# Patient Record
Sex: Male | Born: 1975 | Race: Black or African American | Hispanic: No | Marital: Single | Smoking: Current every day smoker
Health system: Southern US, Community
[De-identification: ages and names within clinical notes are randomized; demographics above are authoritative.]

## PROBLEM LIST (undated history)

## (undated) DIAGNOSIS — F259 Schizoaffective disorder, unspecified: Secondary | ICD-10-CM

## (undated) DIAGNOSIS — F3181 Bipolar II disorder: Secondary | ICD-10-CM

## (undated) DIAGNOSIS — E119 Type 2 diabetes mellitus without complications: Secondary | ICD-10-CM

---

## 1997-10-16 ENCOUNTER — Emergency Department (HOSPITAL_COMMUNITY): Admission: EM | Admit: 1997-10-16 | Discharge: 1997-10-16 | Payer: Self-pay | Admitting: Emergency Medicine

## 1997-11-02 ENCOUNTER — Emergency Department (HOSPITAL_COMMUNITY): Admission: EM | Admit: 1997-11-02 | Discharge: 1997-11-02 | Payer: Self-pay | Admitting: Emergency Medicine

## 1997-11-15 ENCOUNTER — Emergency Department (HOSPITAL_COMMUNITY): Admission: EM | Admit: 1997-11-15 | Discharge: 1997-11-15 | Payer: Self-pay | Admitting: Emergency Medicine

## 2000-09-08 ENCOUNTER — Emergency Department (HOSPITAL_COMMUNITY): Admission: EM | Admit: 2000-09-08 | Discharge: 2000-09-08 | Payer: Self-pay

## 2001-08-26 ENCOUNTER — Inpatient Hospital Stay (HOSPITAL_COMMUNITY): Admission: EM | Admit: 2001-08-26 | Discharge: 2001-09-07 | Payer: Self-pay | Admitting: Psychiatry

## 2004-01-21 ENCOUNTER — Emergency Department: Payer: Self-pay | Admitting: Emergency Medicine

## 2004-07-18 ENCOUNTER — Emergency Department: Payer: Self-pay | Admitting: Unknown Physician Specialty

## 2005-10-09 ENCOUNTER — Emergency Department (HOSPITAL_COMMUNITY): Admission: EM | Admit: 2005-10-09 | Discharge: 2005-10-09 | Payer: Self-pay | Admitting: Emergency Medicine

## 2005-10-30 ENCOUNTER — Inpatient Hospital Stay (HOSPITAL_COMMUNITY): Admission: AD | Admit: 2005-10-30 | Discharge: 2005-11-09 | Payer: Self-pay | Admitting: Psychiatry

## 2005-10-30 ENCOUNTER — Ambulatory Visit: Payer: Self-pay | Admitting: Psychiatry

## 2006-06-14 IMAGING — CR DG FEMUR 2V*L*
1 series · 4 of 4 positions shown · non-contrast
Comparison: none

REASON FOR EXAM: Injury
COMMENTS:

PROCEDURE:     DXR - DXR FEMUR LEFT  - July 18, 2004 [DATE]
RESULT:     AP and lateral views of the LEFT femur show no fracture,
dislocation or other acute bony abnormality. The hip joint space is well
maintained.

[Series 1: view not recorded · 0.17mm/px · 4 of 4 slices shown]
[im 1/4]
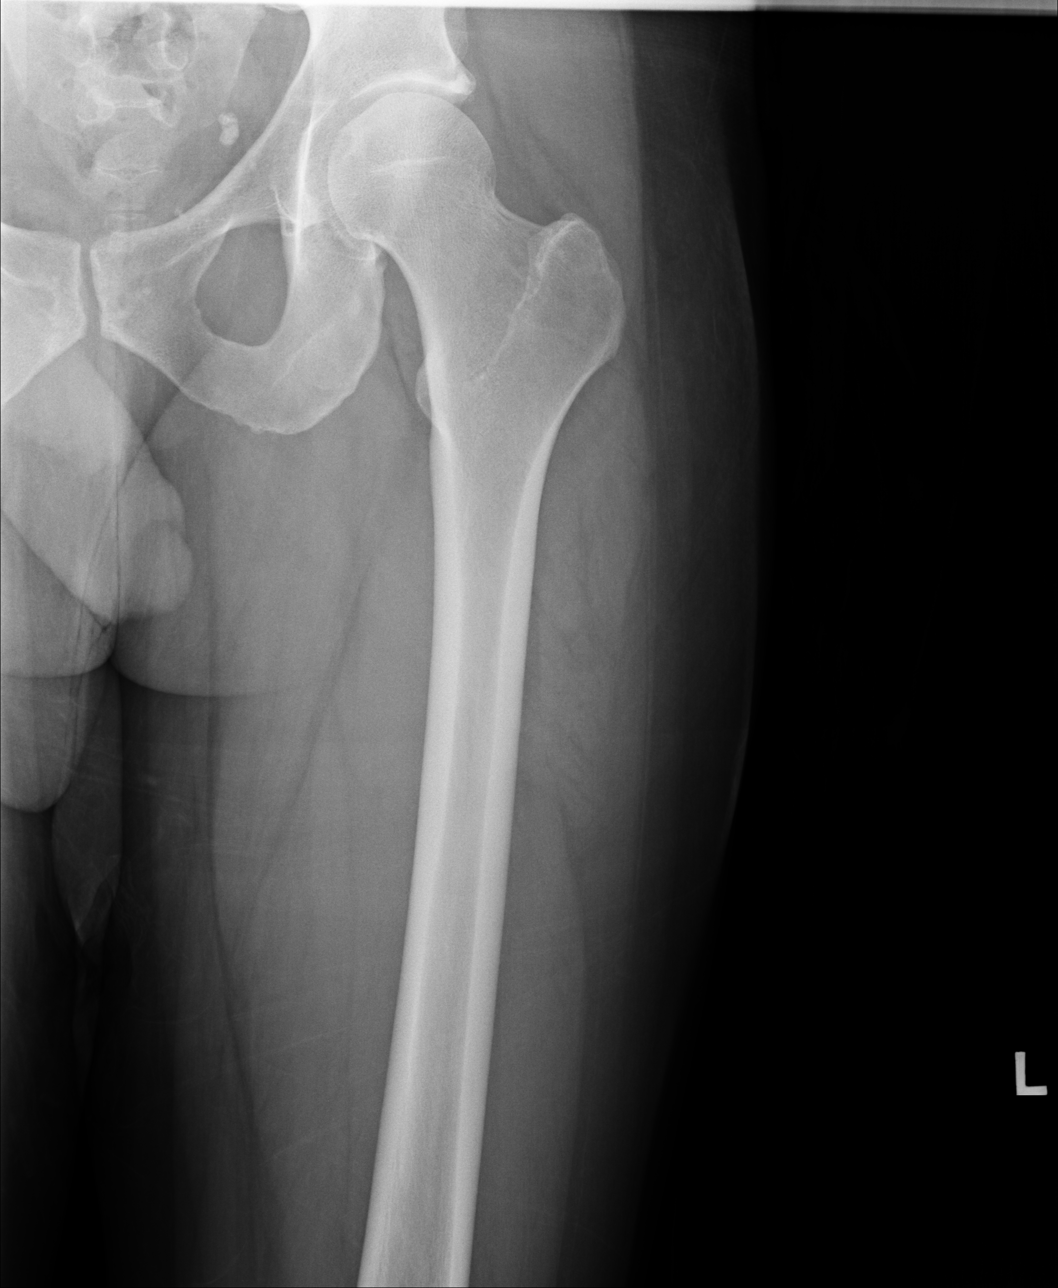
[im 2/4]
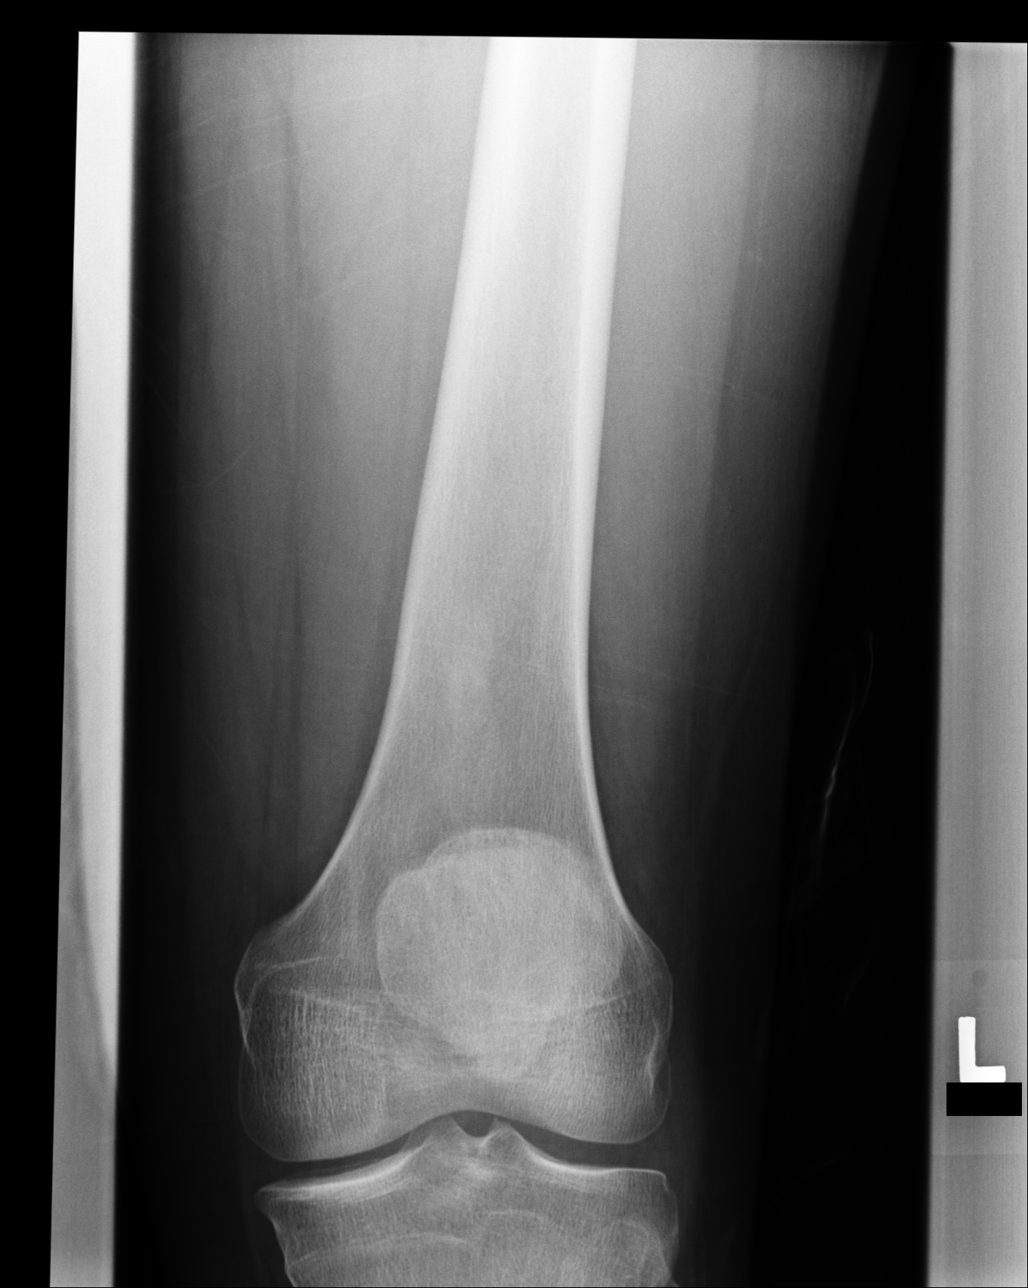
[im 3/4]
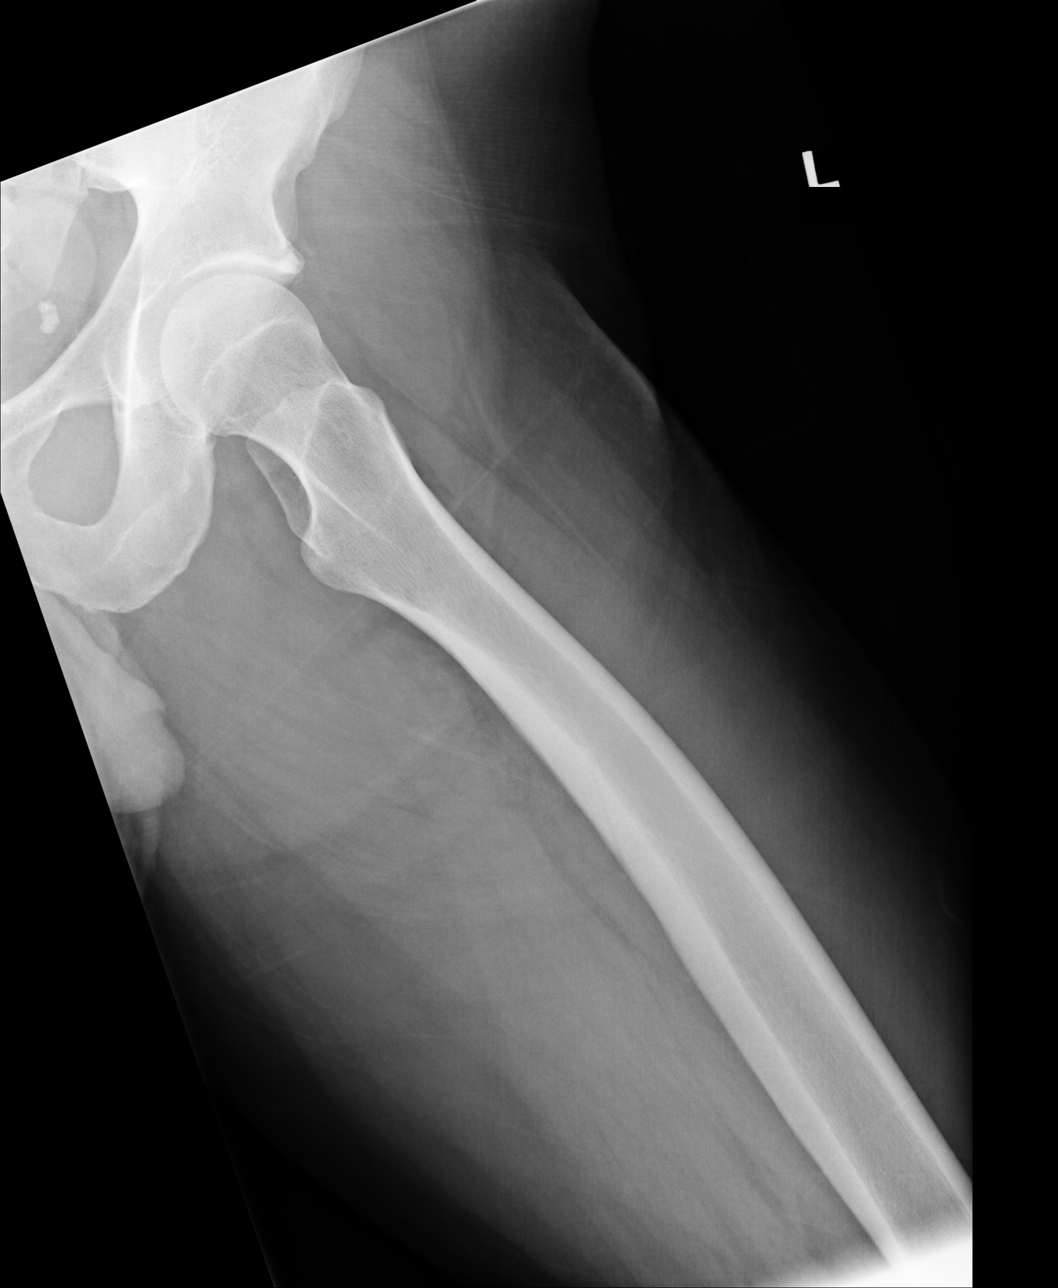
[im 4/4]
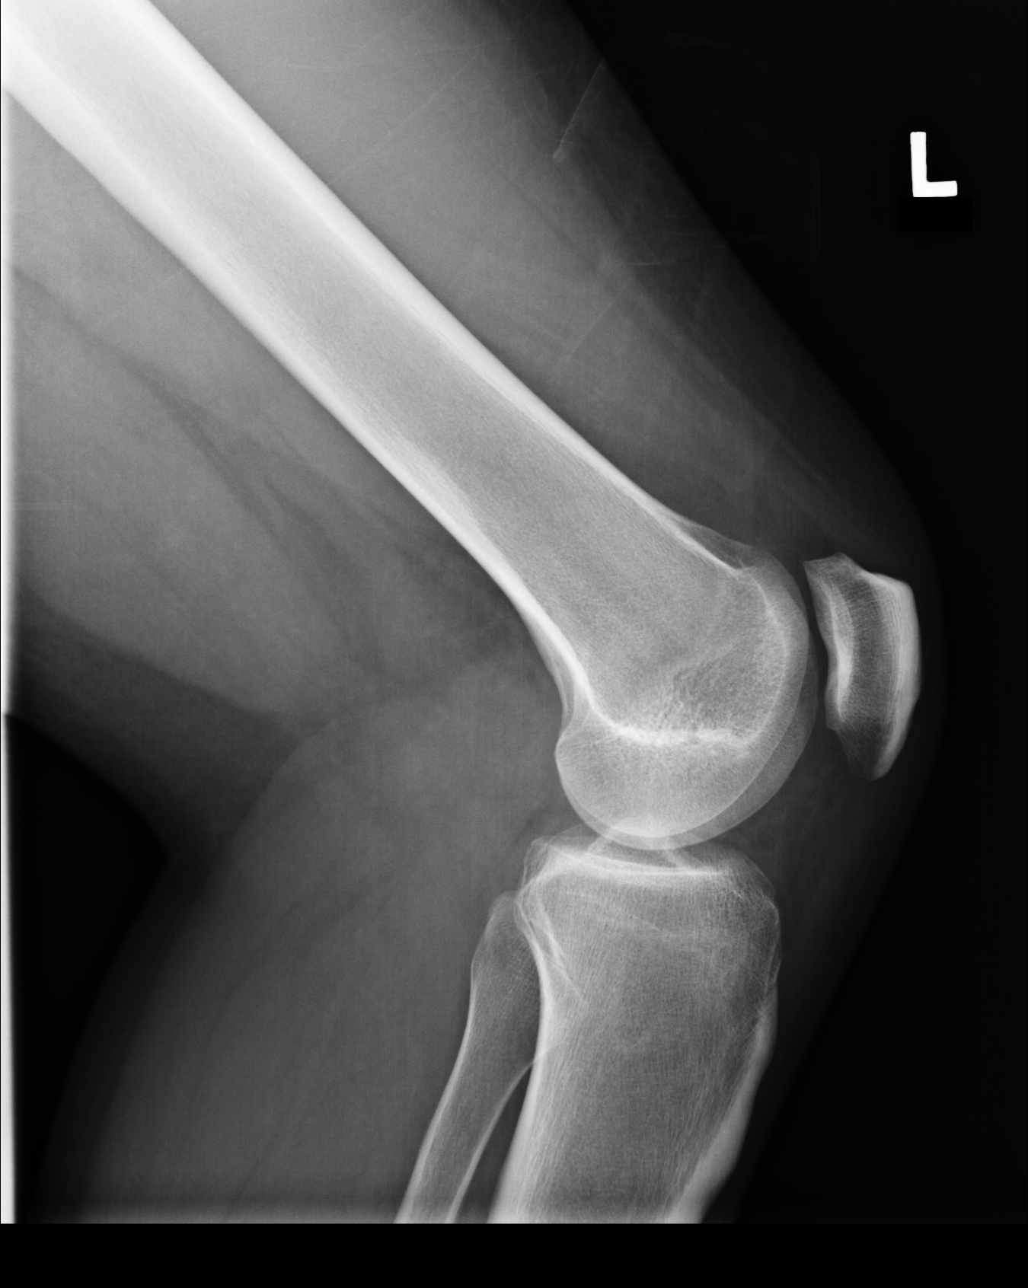

[4 of 4 positions shown; findings below may reference images not displayed]

IMPRESSION: No significant abnormalities are noted.

## 2007-06-26 ENCOUNTER — Inpatient Hospital Stay (HOSPITAL_COMMUNITY): Admission: AD | Admit: 2007-06-26 | Discharge: 2007-07-01 | Payer: Self-pay | Admitting: Psychiatry

## 2007-06-27 ENCOUNTER — Ambulatory Visit: Payer: Self-pay | Admitting: Psychiatry
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
## 2010-12-22 LAB — TSH: TSH: 5.538 — ABNORMAL HIGH

## 2010-12-22 LAB — VALPROIC ACID LEVEL: Valproic Acid Lvl: 35.8 — ABNORMAL LOW

## 2010-12-22 LAB — URINALYSIS, ROUTINE W REFLEX MICROSCOPIC
Bilirubin Urine: NEGATIVE
Ketones, ur: NEGATIVE
Nitrite: NEGATIVE
Urobilinogen, UA: 1
pH: 6.5

## 2010-12-22 LAB — DRUGS OF ABUSE SCREEN W/O ALC, ROUTINE URINE
Benzodiazepines.: NEGATIVE
Opiate Screen, Urine: NEGATIVE
Phencyclidine (PCP): NEGATIVE
Propoxyphene: NEGATIVE

## 2010-12-22 LAB — COMPREHENSIVE METABOLIC PANEL
Albumin: 3.5
BUN: 13
Chloride: 106
Creatinine, Ser: 0.93
GFR calc non Af Amer: >60
Glucose, Bld: 134 — ABNORMAL HIGH
Total Bilirubin: 0.4

## 2010-12-22 LAB — CBC
HCT: 37.3 — ABNORMAL LOW
Hemoglobin: 12.8 — ABNORMAL LOW
MCV: 97.9
Platelets: 141 — ABNORMAL LOW
WBC: 6.4

## 2015-04-26 ENCOUNTER — Encounter (HOSPITAL_COMMUNITY): Payer: Self-pay | Admitting: Emergency Medicine

## 2015-04-26 ENCOUNTER — Emergency Department (HOSPITAL_COMMUNITY)
Admission: EM | Admit: 2015-04-26 | Discharge: 2015-04-27 | Disposition: A | Discharge status: Home / Self Care | Payer: Medicare HMO | Attending: Emergency Medicine | Admitting: Emergency Medicine

## 2015-04-26 DIAGNOSIS — Z7984 Long term (current) use of oral hypoglycemic drugs: Secondary | ICD-10-CM | POA: Diagnosis not present

## 2015-04-26 DIAGNOSIS — F4325 Adjustment disorder with mixed disturbance of emotions and conduct: Secondary | ICD-10-CM | POA: Insufficient documentation

## 2015-04-26 DIAGNOSIS — Z79899 Other long term (current) drug therapy: Secondary | ICD-10-CM | POA: Insufficient documentation

## 2015-04-26 DIAGNOSIS — E119 Type 2 diabetes mellitus without complications: Secondary | ICD-10-CM | POA: Diagnosis not present

## 2015-04-26 DIAGNOSIS — Z7982 Long term (current) use of aspirin: Secondary | ICD-10-CM | POA: Insufficient documentation

## 2015-04-26 DIAGNOSIS — F3181 Bipolar II disorder: Secondary | ICD-10-CM | POA: Diagnosis not present

## 2015-04-26 DIAGNOSIS — F6089 Other specific personality disorders: Secondary | ICD-10-CM | POA: Diagnosis not present

## 2015-04-26 DIAGNOSIS — R4689 Other symptoms and signs involving appearance and behavior: Secondary | ICD-10-CM

## 2015-04-26 DIAGNOSIS — F911 Conduct disorder, childhood-onset type: Secondary | ICD-10-CM | POA: Diagnosis not present

## 2015-04-26 HISTORY — DX: Bipolar II disorder: F31.81

## 2015-04-26 HISTORY — DX: Type 2 diabetes mellitus without complications: E11.9

## 2015-04-26 HISTORY — DX: Schizoaffective disorder, unspecified: F25.9

## 2015-04-26 LAB — CBC
HCT: 37 % — ABNORMAL LOW (ref 39.0–52.0)
Hemoglobin: 12.9 g/dL — ABNORMAL LOW (ref 13.0–17.0)
MCH: 34 pg (ref 26.0–34.0)
MCHC: 34.9 g/dL (ref 30.0–36.0)
MCV: 97.6 fL (ref 78.0–100.0)
Platelets: 235 10*3/uL (ref 150–400)
RBC: 3.79 MIL/uL — ABNORMAL LOW (ref 4.22–5.81)
RDW: 13.1 % (ref 11.5–15.5)
WBC: 7.6 10*3/uL (ref 4.0–10.5)

## 2015-04-26 LAB — COMPREHENSIVE METABOLIC PANEL
ALT: 11 U/L — ABNORMAL LOW (ref 17–63)
AST: 20 U/L (ref 15–41)
Albumin: 4 g/dL (ref 3.5–5.0)
Alkaline Phosphatase: 58 U/L (ref 38–126)
Anion gap: 9 (ref 5–15)
BUN: 10 mg/dL (ref 6–20)
CO2: 22 mmol/L (ref 22–32)
Calcium: 8.6 mg/dL — ABNORMAL LOW (ref 8.9–10.3)
Chloride: 106 mmol/L (ref 101–111)
Creatinine, Ser: 0.78 mg/dL (ref 0.61–1.24)
GFR calc Af Amer: >60 mL/min (ref >60)
GFR calc non Af Amer: >60 mL/min (ref >60)
Glucose, Bld: 180 mg/dL — ABNORMAL HIGH (ref 65–99)
Potassium: 3.7 mmol/L (ref 3.5–5.1)
Sodium: 137 mmol/L (ref 135–145)
Total Bilirubin: 0.4 mg/dL (ref 0.3–1.2)
Total Protein: 7.1 g/dL (ref 6.5–8.1)

## 2015-04-26 LAB — RAPID URINE DRUG SCREEN, HOSP PERFORMED
Amphetamines: NOT DETECTED
Barbiturates: NOT DETECTED
Benzodiazepines: NOT DETECTED
Cocaine: NOT DETECTED
Opiates: NOT DETECTED
Tetrahydrocannabinol: NOT DETECTED

## 2015-04-26 LAB — SALICYLATE LEVEL: Salicylate Lvl: <4 mg/dL (ref 2.8–30.0)

## 2015-04-26 LAB — ACETAMINOPHEN LEVEL: Acetaminophen (Tylenol), Serum: <10 ug/mL — ABNORMAL LOW (ref 10–30)

## 2015-04-26 LAB — ETHANOL: Alcohol, Ethyl (B): <5 mg/dL (ref <5)

## 2015-04-26 MED ORDER — ACETAMINOPHEN 325 MG PO TABS: 650.0000 mg | ORAL_TABLET | ORAL | Status: DC | PRN

## 2015-04-26 MED ORDER — NICOTINE 21 MG/24HR TD PT24
21.0000 mg | MEDICATED_PATCH | Freq: Every day | TRANSDERMAL | Status: DC
  Filled 2015-04-26: qty 1

## 2015-04-26 MED ORDER — LORAZEPAM 1 MG PO TABS: 0.0000 mg | ORAL_TABLET | Freq: Two times a day (BID) | ORAL | Status: DC

## 2015-04-26 MED ORDER — ZOLPIDEM TARTRATE 5 MG PO TABS: 5.0000 mg | ORAL_TABLET | Freq: Every evening | ORAL | Status: DC | PRN

## 2015-04-26 MED ORDER — LORAZEPAM 1 MG PO TABS: 0.0000 mg | ORAL_TABLET | Freq: Four times a day (QID) | ORAL | Status: DC

## 2015-04-26 MED ORDER — LORAZEPAM 1 MG PO TABS: 1.0000 mg | ORAL_TABLET | Freq: Three times a day (TID) | ORAL | Status: DC | PRN

## 2015-04-26 MED ORDER — ALUM & MAG HYDROXIDE-SIMETH 200-200-20 MG/5ML PO SUSP: 30.0000 mL | ORAL | Status: DC | PRN

## 2015-04-26 MED ORDER — ONDANSETRON HCL 4 MG PO TABS: 4.0000 mg | ORAL_TABLET | Freq: Three times a day (TID) | ORAL | Status: DC | PRN

## 2015-04-26 MED ORDER — IBUPROFEN 200 MG PO TABS: 600.0000 mg | ORAL_TABLET | Freq: Three times a day (TID) | ORAL | Status: DC | PRN

## 2015-04-26 NOTE — ED Notes (Signed)
Pt. To [facility] from ED ambulatory without difficulty, to room 35 . Report from Marinda Elk RN. Pt. Is alert and oriented, warm and dry in no distress. Pt. Denies SI, HI, and AVH. Pt. Calm and cooperative. Pt. Made aware of security cameras and Q15 minute rounds. Pt. Encouraged to let Nursing staff know of any concerns or needs.
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
## 2015-04-27 DIAGNOSIS — F4325 Adjustment disorder with mixed disturbance of emotions and conduct: Secondary | ICD-10-CM | POA: Diagnosis not present

## 2015-04-27 DIAGNOSIS — F911 Conduct disorder, childhood-onset type: Secondary | ICD-10-CM | POA: Diagnosis not present

## 2015-04-27 DIAGNOSIS — F6089 Other specific personality disorders: Secondary | ICD-10-CM | POA: Diagnosis not present

## 2015-04-27 DIAGNOSIS — R4689 Other symptoms and signs involving appearance and behavior: Secondary | ICD-10-CM | POA: Insufficient documentation

## 2015-04-27 LAB — CBG MONITORING, ED: Glucose-Capillary: 131 mg/dL — ABNORMAL HIGH (ref 65–99)

## 2015-04-27 MED ORDER — DIVALPROEX SODIUM ER 500 MG PO TB24: 1000.0000 mg | ORAL_TABLET | Freq: Every day | ORAL | Status: DC

## 2015-04-27 MED ORDER — ASPIRIN 81 MG PO CHEW
81.0000 mg | CHEWABLE_TABLET | Freq: Every day | ORAL | Status: DC
  Administered 2015-04-27: 81 mg via ORAL
  Filled 2015-04-27: qty 1

## 2015-04-27 MED ORDER — HALOPERIDOL 5 MG PO TABS: 15.0000 mg | ORAL_TABLET | Freq: Every day | ORAL | Status: DC

## 2015-04-27 MED ORDER — LISINOPRIL 10 MG PO TABS
10.0000 mg | ORAL_TABLET | Freq: Every day | ORAL | Status: DC
  Administered 2015-04-27: 10 mg via ORAL
  Filled 2015-04-27: qty 1

## 2015-04-27 MED ORDER — POTASSIUM CHLORIDE CRYS ER 20 MEQ PO TBCR
40.0000 meq | EXTENDED_RELEASE_TABLET | Freq: Every day | ORAL | Status: DC
  Administered 2015-04-27: 40 meq via ORAL
  Filled 2015-04-27: qty 2

## 2015-04-27 MED ORDER — METFORMIN HCL 500 MG PO TABS
500.0000 mg | ORAL_TABLET | Freq: Two times a day (BID) | ORAL | Status: DC
  Filled 2015-04-27 (×2): qty 1

## 2015-04-27 NOTE — ED Notes (Signed)
Up to the bathroom 

## 2015-04-27 NOTE — ED Notes (Signed)
Pt. Noted sleeping in room. No complaints or concerns voiced. No distress or abnormal behavior noted. Will continue to monitor with security cameras. Q 15 minute rounds continue. 

## 2015-04-27 NOTE — Clinical Social Work Note (Signed)
CSW faxed pt's rescinded IVC paperwork to the magistrates office.   Akshitha Culmer LCSW [facility]

## 2015-04-27 NOTE — ED Notes (Addendum)
Written dc instructions reviewed w/ Pt.  Pt encouraged to take his medications as directed and if to work with/notify staff if he becomes upset with another resident.  Pt verbalized understanding. Pt ambulatory w/o difficulty to [facility] w/ [facility], belongings returned, MAR and copy of dc instructions sent w/ pt for [facility].

## 2015-04-27 NOTE — [facility] Suicide Risk Assessment (Signed)
Suicide Risk Assessment  Discharge Assessment   [facility] Discharge Suicide Risk Assessment   Principal Problem: Adjustment disorder with mixed disturbance of emotions and conduct Discharge Diagnoses:  Patient Active Problem List   Diagnosis Date Noted  . Adjustment disorder with mixed disturbance of emotions and conduct [F43.25] 04/27/2015    Priority: High  . Aggressive behavior [F60.89]     Total Time spent with patient: 45 minutes   Musculoskeletal: Strength & Muscle Tone: within normal limits Gait & Station: normal Patient leans: N/A  Psychiatric Specialty Exam: Review of Systems  Constitutional: Negative.   HENT: Negative.   Eyes: Negative.   Respiratory: Negative.   Cardiovascular: Negative.   Gastrointestinal: Negative.   Genitourinary: Negative.   Musculoskeletal: Negative.   Skin: Negative.   Neurological: Negative.   Endo/Heme/Allergies: Negative.   Psychiatric/Behavioral:       Negative    Blood pressure 119/85, pulse 82, temperature 97.8 F (36.6 C), temperature source Oral, resp. rate 18, SpO2 99 %.There is no height or weight on file to calculate BMI.  General Appearance: Casual  Eye Contact::  Good  Speech:  Normal Rate  Volume:  Normal  Mood:  Euthymic  Affect:  Congruent  Thought Process:  Coherent  Orientation:  Full (Time, Place, and Person)  Thought Content:  WDL  Suicidal Thoughts:  No  Homicidal Thoughts:  No  Memory:  Immediate;   Fair Recent;   Fair Remote;   Fair  Judgement:  Fair  Insight:  Good  Psychomotor Activity:  Normal  Concentration:  Good  Recall:  Good  Fund of Knowledge: Fair  Language: Good  Akathisia:  No  Handed:  Right  AIMS (if indicated):     Assets:  Housing Leisure Time Physical Health Resilience Social Support  ADL's:  Intact  Cognition: Impaired,  Mild  Sleep:       Mental Status Per Nursing Assessment::   On Admission:   agitation   Demographic Factors:  Male  Loss Factors: NA  Historical  Factors: Impulsivity  Risk Reduction Factors:   Sense of responsibility to family, Living with another person, especially a relative, Positive social support and Positive therapeutic relationship  Continued Clinical Symptoms:  None  Cognitive Features That Contribute To Risk:  None    Suicide Risk:  Minimal: No identifiable suicidal ideation.  Patients presenting with no risk factors but with morbid ruminations; may be classified as minimal risk based on the severity of the depressive symptoms    Plan Of Care/Follow-up recommendations:  Activity:  as tolerated  Diet:  heart healthy diet  Keyonia Gluth, NP 04/27/2015, 4:55 PM
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
## 2015-04-27 NOTE — Consult Note (Signed)
[facility] Face-to-Face Psychiatry Consult   Reason for Consult:  Threats of harm to others and self Referring Physician:  EDP Patient Identification: Jason Jenkins MRN:  [id number] Principal Diagnosis: Adjustment disorder with mixed disturbance of emotions and conduct Diagnosis:   Patient Active Problem List   Diagnosis Date Noted  . Adjustment disorder with mixed disturbance of emotions and conduct [F43.25] 04/27/2015    Priority: High    Total Time spent with patient: 45 minutes  Subjective:   Jason Jenkins is a 40 y.o. male patient does not warrant admission.  HPI:  On admission:  40 y.o. male.  -Clinician reviewed note by Clayton Bibles, PA. Pt had an argument with another resident at [facility]. Patient reportedly had threatened harm to the other resident and staff. The [facility] staff had taken out IVC papers on patient.  Patient was drowsy when assessed. He did not give much information. Patient is unclear about how long he has been living at [facility]. Patient thought he had been there for a year. Patient denies making threats to harm staff and residents. Patient acknowledges that he and other resident did argue but that it did not escalate to being physical. Pt says that he went back to his room after the argument and then the police picked him up later and brought him to [facility].  Patient denies any current SI, HI or A/V hallucinations. He has no current psychiatrist. He goes to a day program called "[facility]" on the weekdays. Patient cites his mother and extended family as being supportive of him.  IVC papers record that patient has made threats to staff to harm them with a knife. Patient talks about having killed a man and the he would kill himself also. Patient is prescribed haldol and depakote which he takes regularly.  Today:  Patient denies suicidal/homicidal ideations, hallucinations, and alcohol/drug abuse.  Calm on assessment and requests to return to [facility].  He reports another resident upset him yesterday but he is no longer upset.  Mr. Vivona appears to be intellectually limited but no threat to himself.  Group home will be notified and he may return.  Past Psychiatric History: Aggression, mood disorder  Risk to Self: Suicidal Ideation: No Suicidal Intent: No Is patient at risk for suicide?: No Suicidal Plan?: No Access to Means: No What has been your use of drugs/alcohol within the last 12 months?: Pt denies How many times?: 0 Other Self Harm Risks: None Triggers for Past Attempts: None known Intentional Self Injurious Behavior: None Risk to Others: Homicidal Ideation: No Thoughts of Harm to Others: No Current Homicidal Intent: No Current Homicidal Plan: No Access to Homicidal Means: No Identified Victim: No one History of harm to others?: No Assessment of Violence: None Noted Violent Behavior Description: Pt denies Does patient have access to weapons?: No Criminal Charges Pending?: No Does patient have a court date: No Prior Inpatient Therapy: Prior Inpatient Therapy: Yes Prior Therapy Dates: Pt cannot recall Prior Therapy Facilty/Provider(s): [facility] Reason for Treatment: depression Prior Outpatient Therapy: Prior Outpatient Therapy: Yes Prior Therapy Dates: Current Prior Therapy Facilty/Provider(s): [facility] day program Reason for Treatment: Day progrming Does patient have an ACCT team?: No Does patient have Intensive In-House Services?  : No Does patient have [facility] services? : No Does patient have [facility] services?: No  Past Medical History:  Past Medical History  Diagnosis Date  . Bipolar 2 disorder ([place])   . Diabetes mellitus without complication ([place])   .  Schizoaffective disorder ([place])    History reviewed. No pertinent past surgical history. Family History: No family history on file. Family Psychiatric  History: None Social History:  History  Alcohol Use No     History  Drug Use No    Social  History   Social History  . Marital Status: Single    Spouse Name: N/A  . Number of Children: N/A  . Years of Education: N/A   Social History Main Topics  . Smoking status: Never Smoker   . Smokeless tobacco: None  . Alcohol Use: No  . Drug Use: No  . Sexual Activity: Not Asked   Other Topics Concern  . None   Social History Narrative  . None   Additional Social History:    Pain Medications: See PTA medication list Prescriptions: See PTA medication list Over the Counter: See PTA medication list History of alcohol / drug use?: No history of alcohol / drug abuse                     Allergies:   Allergies  Allergen Reactions  . Bee Venom   . Fish Allergy     Labs:  Results for orders placed or performed during the hospital encounter of 04/26/15 (from the past 48 hour(s))  Comprehensive metabolic panel     Status: Abnormal   Collection Time: 04/26/15  9:39 PM  Result Value Ref Range   Sodium 137 135 - 145 mmol/L   Potassium 3.7 3.5 - 5.1 mmol/L   Chloride 106 101 - 111 mmol/L   CO2 22 22 - 32 mmol/L   Glucose, Bld 180 (H) 65 - 99 mg/dL   BUN 10 6 - 20 mg/dL   Creatinine, Ser 0.78 0.61 - 1.24 mg/dL   Calcium 8.6 (L) 8.9 - 10.3 mg/dL   Total Protein 7.1 6.5 - 8.1 g/dL   Albumin 4.0 3.5 - 5.0 g/dL   AST 20 15 - 41 U/L   ALT 11 (L) 17 - 63 U/L   Alkaline Phosphatase 58 38 - 126 U/L   Total Bilirubin 0.4 0.3 - 1.2 mg/dL   GFR calc non Af Amer >60 >60 mL/min   GFR calc Af Amer >60 >60 mL/min    Comment: (NOTE) The eGFR has been calculated using the CKD EPI equation. This calculation has not been validated in all clinical situations. eGFR's persistently <60 mL/min signify possible Chronic Kidney Disease.    Anion gap 9 5 - 15  Ethanol (ETOH)     Status: None   Collection Time: 04/26/15  9:39 PM  Result Value Ref Range   Alcohol, Ethyl (B) <5 <5 mg/dL    Comment:        LOWEST DETECTABLE LIMIT FOR SERUM ALCOHOL IS 5 mg/dL FOR MEDICAL PURPOSES ONLY    Salicylate level     Status: None   Collection Time: 04/26/15  9:39 PM  Result Value Ref Range   Salicylate Lvl <2.9 2.8 - 30.0 mg/dL  Acetaminophen level     Status: Abnormal   Collection Time: 04/26/15  9:39 PM  Result Value Ref Range   Acetaminophen (Tylenol), Serum <10 (L) 10 - 30 ug/mL    Comment:        THERAPEUTIC CONCENTRATIONS VARY SIGNIFICANTLY. A RANGE OF 10-30 ug/mL MAY BE AN EFFECTIVE CONCENTRATION FOR MANY PATIENTS. HOWEVER, SOME ARE BEST TREATED AT CONCENTRATIONS OUTSIDE THIS RANGE. ACETAMINOPHEN CONCENTRATIONS >150 ug/mL AT 4 HOURS AFTER INGESTION AND >50 ug/mL AT 12  HOURS AFTER INGESTION ARE OFTEN ASSOCIATED WITH TOXIC REACTIONS.   CBC     Status: Abnormal   Collection Time: 04/26/15  9:39 PM  Result Value Ref Range   WBC 7.6 4.0 - 10.5 K/uL   RBC 3.79 (L) 4.22 - 5.81 MIL/uL   Hemoglobin 12.9 (L) 13.0 - 17.0 g/dL   HCT 37.0 (L) 39.0 - 52.0 %   MCV 97.6 78.0 - 100.0 fL   MCH 34.0 26.0 - 34.0 pg   MCHC 34.9 30.0 - 36.0 g/dL   RDW 13.1 11.5 - 15.5 %   Platelets 235 150 - 400 K/uL  Urine rapid drug screen (hosp performed) (Not at [facility])     Status: None   Collection Time: 04/26/15 10:19 PM  Result Value Ref Range   Opiates NONE DETECTED NONE DETECTED   Cocaine NONE DETECTED NONE DETECTED   Benzodiazepines NONE DETECTED NONE DETECTED   Amphetamines NONE DETECTED NONE DETECTED   Tetrahydrocannabinol NONE DETECTED NONE DETECTED   Barbiturates NONE DETECTED NONE DETECTED    Comment:        DRUG SCREEN FOR MEDICAL PURPOSES ONLY.  IF CONFIRMATION IS NEEDED FOR ANY PURPOSE, NOTIFY LAB WITHIN 5 DAYS.        LOWEST DETECTABLE LIMITS FOR URINE DRUG SCREEN Drug Class       Cutoff (ng/mL) Amphetamine      1000 Barbiturate      200 Benzodiazepine   034 Tricyclics       742 Opiates          300 Cocaine          300 THC              50   CBG monitoring, ED     Status: Abnormal   Collection Time: 04/27/15  8:16 AM  Result Value Ref Range    Glucose-Capillary 131 (H) 65 - 99 mg/dL    Current Facility-Administered Medications  Medication Dose Route Frequency Provider Last Rate Last Dose  . acetaminophen (TYLENOL) tablet 650 mg  650 mg Oral Q4H PRN Clayton Bibles, PA-C      . alum & mag hydroxide-simeth (MAALOX/MYLANTA) 200-200-20 MG/5ML suspension 30 mL  30 mL Oral PRN Clayton Bibles, PA-C      . aspirin chewable tablet 81 mg  81 mg Oral Daily Patrecia Pour, NP      . divalproex (DEPAKOTE ER) 24 hr tablet 1,000 mg  1,000 mg Oral QHS Patrecia Pour, NP      . haloperidol (HALDOL) tablet 15 mg  15 mg Oral QHS Patrecia Pour, NP      . ibuprofen (ADVIL,MOTRIN) tablet 600 mg  600 mg Oral Q8H PRN Emily West, PA-C      . lisinopril (PRINIVIL,ZESTRIL) tablet 10 mg  10 mg Oral Daily Patrecia Pour, NP      . metFORMIN (GLUCOPHAGE) tablet 500 mg  500 mg Oral BID WC Patrecia Pour, NP      . nicotine (NICODERM CQ - dosed in mg/24 hours) patch 21 mg  21 mg Transdermal Daily Emily West, PA-C      . ondansetron [facility]) tablet 4 mg  4 mg Oral Q8H PRN Emily West, PA-C      . potassium chloride SA (K-DUR,KLOR-CON) CR tablet 40 mEq  40 mEq Oral Daily Patrecia Pour, NP      . zolpidem (AMBIEN) tablet 5 mg  5 mg Oral QHS PRN Clayton Bibles, PA-C  Current Outpatient Prescriptions  Medication Sig Dispense Refill  . aspirin 81 MG tablet Take 81 mg by mouth daily.    . divalproex (DEPAKOTE ER) 500 MG 24 hr tablet Take 1,000 mg by mouth at bedtime.    . haloperidol (HALDOL) 10 MG tablet Take 15 mg by mouth at bedtime.    Marland Kitchen lisinopril (PRINIVIL,ZESTRIL) 10 MG tablet Take 10 mg by mouth daily.    . metFORMIN (GLUCOPHAGE) 500 MG tablet Take 500 mg by mouth 2 (two) times daily with a meal.    . potassium chloride SA (K-DUR,KLOR-CON) 20 MEQ tablet Take 40 mEq by mouth daily.       Musculoskeletal: Strength & Muscle Tone: within normal limits Gait & Station: normal Patient leans: N/A  Psychiatric Specialty Exam: Review of Systems  Constitutional:  Negative.   HENT: Negative.   Eyes: Negative.   Respiratory: Negative.   Cardiovascular: Negative.   Gastrointestinal: Negative.   Genitourinary: Negative.   Musculoskeletal: Negative.   Skin: Negative.   Neurological: Negative.   Endo/Heme/Allergies: Negative.   Psychiatric/Behavioral:       Negative    Blood pressure 119/85, pulse 82, temperature 97.8 F (36.6 C), temperature source Oral, resp. rate 18, SpO2 99 %.There is no height or weight on file to calculate BMI.  General Appearance: Casual  Eye Contact::  Good  Speech:  Normal Rate  Volume:  Normal  Mood:  Euthymic  Affect:  Congruent  Thought Process:  Coherent  Orientation:  Full (Time, Place, and Person)  Thought Content:  WDL  Suicidal Thoughts:  No  Homicidal Thoughts:  No  Memory:  Immediate;   Fair Recent;   Fair Remote;   Fair  Judgement:  Fair  Insight:  Good  Psychomotor Activity:  Normal  Concentration:  Good  Recall:  Good  Fund of Knowledge: Fair  Language: Good  Akathisia:  No  Handed:  Right  AIMS (if indicated):     Assets:  Housing Leisure Time Physical Health Resilience Social Support  ADL's:  Intact  Cognition: Impaired,  Mild  Sleep:      Treatment Plan Summary: Daily contact with patient to assess and evaluate symptoms and progress in treatment, Medication management and Plan adjustment disorder with mixed disturbance of emotions and conduct: -Crisis stabilization -Medication management:  Depakote 1000 mg at bedtime for mood stable, Haldol 15 mg at bedtime for psychosis -Individual counseling  Disposition: No evidence of imminent risk to self or others at present.    Waylan Boga, [place] 04/27/2015 11:54 AM Patient seen face to face for psychiatric evaluation. Chart reviewed and finding discussed with Physician extender. Agreed with disposition and treatment plan.   Berniece Andreas, MD

## 2015-04-27 NOTE — ED Notes (Signed)
Pt to be dc'd back to [facility],  CSW to arrainge  transport

## 2015-04-27 NOTE — ED Notes (Signed)
CSW into see 

## 2015-04-27 NOTE — Clinical Social Work Note (Signed)
CSW called [facility] for pt transport back to [facility] Jason Neale LCSW [facility]

## 2015-04-27 NOTE — ED Notes (Signed)
IVC has been rescinded per [place] CSW

## 2015-04-27 NOTE — Clinical Social Work Note (Signed)
CSW called and spoke with [facility] to provide information that pt is ready for discharge back to ALF.   [facility] stated that they would have to call their administrator to see if they could accept pt back.  CSW provided phone number for call back regarding pt discharge  [facility] LCSW Lydia Guiles

## 2015-04-27 NOTE — ED Notes (Signed)
Report called to [facility] S[facility] supervisor [facility].  Pt to be transported by [facility]

## 2015-04-27 NOTE — Clinical Social Work Note (Signed)
CSW spoke with [facility] who stated that pt can be sent back to facility via [facility]  CSW provided transportation form to RN and let pt know that he would be returning to ALF via  Mariel Kanskyina Merton Wadlow LCSW [facility]

## 2015-04-27 NOTE — ED Notes (Signed)
[facility] here to transport  

## 2015-04-27 NOTE — ED Notes (Signed)
Pt denies any s/s of alcohol withdrawal and reports that he does not drink alcohol

## 2015-04-27 NOTE — ED Notes (Signed)
Telepsych machine to pts. Room.

## 2016-07-02 DIAGNOSIS — F250 Schizoaffective disorder, bipolar type: Secondary | ICD-10-CM | POA: Diagnosis not present

## 2016-07-02 DIAGNOSIS — F29 Unspecified psychosis not due to a substance or known physiological condition: Secondary | ICD-10-CM | POA: Diagnosis not present

## 2016-07-20 ENCOUNTER — Encounter (HOSPITAL_COMMUNITY): Payer: Self-pay | Admitting: Emergency Medicine

## 2016-07-20 ENCOUNTER — Emergency Department (HOSPITAL_COMMUNITY)
Admission: EM | Admit: 2016-07-20 | Discharge: 2016-07-21 | Disposition: A | Discharge status: Home / Self Care | Payer: Medicare Other | Attending: Emergency Medicine | Admitting: Emergency Medicine

## 2016-07-20 DIAGNOSIS — F250 Schizoaffective disorder, bipolar type: Secondary | ICD-10-CM

## 2016-07-20 DIAGNOSIS — E119 Type 2 diabetes mellitus without complications: Secondary | ICD-10-CM | POA: Insufficient documentation

## 2016-07-20 DIAGNOSIS — F918 Other conduct disorders: Secondary | ICD-10-CM | POA: Diagnosis present

## 2016-07-20 DIAGNOSIS — F4325 Adjustment disorder with mixed disturbance of emotions and conduct: Secondary | ICD-10-CM | POA: Diagnosis present

## 2016-07-20 DIAGNOSIS — Z7984 Long term (current) use of oral hypoglycemic drugs: Secondary | ICD-10-CM | POA: Diagnosis not present

## 2016-07-20 DIAGNOSIS — Z79899 Other long term (current) drug therapy: Secondary | ICD-10-CM | POA: Diagnosis not present

## 2016-07-20 LAB — I-STAT CHEM 8, ED
BUN: 4 mg/dL — AB (ref 6–20)
CHLORIDE: 101 mmol/L (ref 101–111)
Calcium, Ion: 1.15 mmol/L (ref 1.15–1.40)
Creatinine, Ser: 0.9 mg/dL (ref 0.61–1.24)
Glucose, Bld: 94 mg/dL (ref 65–99)
HCT: 40 % (ref 39.0–52.0)
Hemoglobin: 13.6 g/dL (ref 13.0–17.0)
POTASSIUM: 3.8 mmol/L (ref 3.5–5.1)
Sodium: 140 mmol/L (ref 135–145)
TCO2: 29 mmol/L (ref 0–100)

## 2016-07-20 LAB — CBG MONITORING, ED: Glucose-Capillary: 102 mg/dL — ABNORMAL HIGH (ref 65–99)

## 2016-07-20 MED ORDER — LISINOPRIL 10 MG PO TABS
10.0000 mg | ORAL_TABLET | Freq: Every day | ORAL | Status: DC
  Administered 2016-07-20 – 2016-07-21 (×2): 10 mg via ORAL
  Filled 2016-07-20 (×2): qty 1

## 2016-07-20 MED ORDER — METFORMIN HCL 500 MG PO TABS
500.0000 mg | ORAL_TABLET | Freq: Two times a day (BID) | ORAL | Status: DC
  Administered 2016-07-21: 500 mg via ORAL
  Filled 2016-07-20: qty 1

## 2016-07-20 MED ORDER — ACETAMINOPHEN 325 MG PO TABS: 650.0000 mg | ORAL_TABLET | Freq: Four times a day (QID) | ORAL | Status: DC | PRN

## 2016-07-20 MED ORDER — POTASSIUM CHLORIDE CRYS ER 20 MEQ PO TBCR
40.0000 meq | EXTENDED_RELEASE_TABLET | Freq: Every day | ORAL | Status: DC
  Administered 2016-07-20 – 2016-07-21 (×2): 40 meq via ORAL
  Filled 2016-07-20 (×2): qty 2

## 2016-07-20 MED ORDER — HALOPERIDOL 5 MG PO TABS
15.0000 mg | ORAL_TABLET | Freq: Every day | ORAL | Status: DC
  Administered 2016-07-20: 15 mg via ORAL
  Filled 2016-07-20: qty 3

## 2016-07-20 MED ORDER — DIVALPROEX SODIUM ER 500 MG PO TB24
1000.0000 mg | ORAL_TABLET | Freq: Every day | ORAL | Status: DC
  Administered 2016-07-20: 1000 mg via ORAL
  Filled 2016-07-20: qty 2

## 2016-07-20 NOTE — ED Notes (Signed)
Note:  Per [facility], pt cannot return to their facility d/t aggressive behavior.

## 2016-07-20 NOTE — ED Triage Notes (Signed)
Pt BIB [employer] IVC from [facility] for psychiatric evaluation. Pt denies SI/HI or A/VH hallucinations with triage. Pt calm and cooperative.

## 2016-07-20 NOTE — ED Notes (Signed)
Pt presents from [facility] as referral.  [facility] states unable to accept pt due to aggressive behavior.  Pt states a worker at [facility] [employer] lied on him and he became upset.  Pt states he is calm & will not hurt staff.  A&O x 3, no distress noted, calm & cooperative.  Denies SI, HI or AVH.  Denies feeling hopeless.  Pt reports he goes to [facility] for counseling.  Pt is a non insulin dependent Diabetic.  Monitoring for safety, Q 15 min checks in effect.  Safety check for contraband completed, no items found.  Pt changed into [place] [employer] and yellow socks upon arrival to dept.

## 2016-07-20 NOTE — ED Notes (Signed)
Bed: ZO10 Expected date:  Expected time:  Means of arrival:  Comments: EMS-combative

## 2016-07-20 NOTE — ED Provider Notes (Signed)
[facility]-EMERGENCY DEPT Provider Note   CSN: [id number] Arrival date & time: 07/20/16  1656     History   Chief Complaint Chief Complaint  Patient presents with  . IVC    HPI Jason Jenkins is a 41 y.o. male.  HPI  Sent here from [facility] for "aggressive behavior." [employer] reported that the facility mentioned that the patient got in a staff member "face." Pt states that he "spoke up" when they were "trying to lie on me." No reported physical aggression reported. No acute complaints by the patient.  Pt denies SI, HI, or AVH.   Past Medical History:  Diagnosis Date  . Bipolar 2 disorder ([facility])   . Diabetes mellitus without complication ([facility])   . Schizoaffective disorder [facility])     Patient Active Problem List   Diagnosis Date Noted  . Adjustment disorder with mixed disturbance of emotions and conduct 04/27/2015  . Aggressive behavior     History reviewed. No pertinent surgical history.     Home Medications    Prior to Admission medications   Medication Sig Start Date End Date Taking? Authorizing Provider  divalproex (DEPAKOTE ER) 500 MG 24 hr tablet Take 1,000 mg by mouth at bedtime.   Yes Historical Provider, MD  haloperidol (HALDOL) 10 MG tablet Take 15 mg by mouth at bedtime.   Yes Historical Provider, MD  lisinopril (PRINIVIL,ZESTRIL) 10 MG tablet Take 10 mg by mouth daily.   Yes Historical Provider, MD  metFORMIN (GLUCOPHAGE) 500 MG tablet Take 500 mg by mouth 2 (two) times daily with a meal.   Yes Historical Provider, MD  potassium chloride SA (K-DUR,KLOR-CON) 20 MEQ tablet Take 40 mEq by mouth daily.    Yes Historical Provider, MD    Family History No family history on file.  Social History Social History  Substance Use Topics  . Smoking status: Never Smoker  . Smokeless tobacco: Not on file  . Alcohol use No     Allergies   Bee venom and Fish allergy   Review of Systems Review of Systems All other systems are reviewed and are negative for acute  change except as noted in the HPI   Physical Exam Updated Vital Signs BP (!) 145/81 (BP Location: Left Arm)   Pulse 87   Temp 98 F (36.7 C) (Oral)   Resp 18   SpO2 100%   Physical Exam  Constitutional: He is oriented to person, place, and time. He appears well-developed and well-nourished. No distress.  HENT:  Head: Normocephalic and atraumatic.  Nose: Nose normal.  Eyes: Conjunctivae and EOM are normal. Pupils are equal, round, and reactive to light. Right eye exhibits no discharge. Left eye exhibits no discharge. No scleral icterus.  Neck: Normal range of motion. Neck supple.  Cardiovascular: Normal rate and regular rhythm.  Exam reveals no gallop and no friction rub.   No murmur heard. Pulmonary/Chest: Effort normal and breath sounds normal. No stridor. No respiratory distress. He has no rales.  Abdominal: Soft. He exhibits no distension. There is no tenderness.  Musculoskeletal: He exhibits no edema or tenderness.  Neurological: He is alert and oriented to person, place, and time.  Skin: Skin is warm and dry. No rash noted. He is not diaphoretic. No erythema.  Psychiatric: He has a normal mood and affect. His speech is normal and behavior is normal. Thought content normal. He is not actively hallucinating.  Vitals reviewed.    ED Treatments / Results  Labs (all labs ordered are listed,  but only abnormal results are displayed) Labs Reviewed - No data to display  EKG  EKG Interpretation None       Radiology No results found.  Procedures Procedures (including critical care time)  Medications Ordered in ED Medications - No data to display   Initial Impression / Assessment and Plan / ED Course  I have reviewed the triage vital signs and the nursing notes.  Pertinent labs & imaging results that were available during my care of the patient were reviewed by me and considered in my medical decision making (see chart for details).     Patient presents for  aggressive behavior. Pertinent [employer] the patient has been well behaved. Here he has had no behavioral misconduct. The patient has IVC paperwork from previous and is currently waiting for placement at a state facility. We'll touch base with TTS for assistance with placement. Home medicine ordered.     Nira Conn, MD 07/21/16 [phone]

## 2016-07-20 NOTE — BH Assessment (Signed)
Assessment Note  Jason Jenkins is an 41 y.o. male with history of Schizoaffective Disorder and Bipolar Disorder. Patient presents to [facility] from [place]. Patient BIB by [facility] with IVC in place.  Patient sent by [facility] due to "aggressive behavior". Patient sts, "They are telling a lie". Patient unable to elaborate on any further details. Patient denies SI, HI, and AVH's. Patient denies alcohol and drug use. Patient sts that he has a ACT provider ([facility]). Patient's hospitalized at [facility] by 10/30/2005, 07/30/2001, and 06/26/2007.     Diagnosis: Schizoaffective Disorder and Bipolar Disorder  Past Medical History:  Past Medical History:  Diagnosis Date  . Bipolar 2 disorder (HCC)   . Diabetes mellitus without complication (HCC)   . Schizoaffective disorder (HCC)     History reviewed. No pertinent surgical history.  Family History: No family history on file.  Social History:  reports that he has never smoked. He does not have any smokeless tobacco history on file. He reports that he does not drink alcohol or use drugs.  Additional Social History:     CIWA: CIWA-Ar BP: (!) 145/81 Pulse Rate: 87 COWS:    Allergies:  Allergies  Allergen Reactions  . Bee Venom   . Fish Allergy     Home Medications:  (Not in a hospital admission)  OB/GYN Status:  No LMP for male patient.  General Assessment Data Location of Assessment: [facility] ED TTS Assessment: In system Is this a Tele or Face-to-Face Assessment?: Face-to-Face Is this an Initial Assessment or a Re-assessment for this encounter?: Initial Assessment Marital status: Single Maiden name:  (n/a) Is patient pregnant?: Yes Pregnancy Status: No Living Arrangements: Other (Comment) (homeless) Can pt return to current living arrangement?: Yes Admission Status: Voluntary Is patient capable of signing voluntary admission?: Yes Referral Source: Self/Family/Friend Insurance type:  Multimedia programmer and Medicare)     Crisis Care Plan Living Arrangements:  Other (Comment) (homeless) Legal Guardian: Other: (no legal guardian ) Name of Psychiatrist:  (no psychiatrist ) Name of Therapist:  (no therapist )  Education Status Is patient currently in school?: No Current Grade:  (n/a) Highest grade of school patient has completed:  (n/a) Name of school:  (n/a) Contact person:  (n/a)  Risk to self with the past 6 months Suicidal Ideation: No Has patient been a risk to self within the past 6 months prior to admission? : No Suicidal Intent: No Has patient had any suicidal intent within the past 6 months prior to admission? : No Is patient at risk for suicide?: No Suicidal Plan?: No Has patient had any suicidal plan within the past 6 months prior to admission? : No Access to Means: No What has been your use of drugs/alcohol within the last 12 months?:  (n/a) Previous Attempts/Gestures: No How many times?:  (0) Other Self Harm Risks:  (no self harm ) Triggers for Past Attempts: Other (Comment) (no previous attempts or gestures ) Intentional Self Injurious Behavior: None Family Suicide History: No Recent stressful life event(s): Other (Comment) Persecutory voices/beliefs?: No Depression: Yes Depression Symptoms:  (patient denies depressive symptoms ) Substance abuse history and/or treatment for substance abuse?: No Suicide prevention information given to non-admitted patients: Not applicable  Risk to Others within the past 6 months Homicidal Ideation: No Does patient have any lifetime risk of violence toward others beyond the six months prior to admission? : No Thoughts of Harm to Others: No Current Homicidal Intent: No Current Homicidal Plan: No Access to Homicidal Means: No Identified Victim:  (n/a)  History of harm to others?: No Assessment of Violence: None Noted Violent Behavior Description:  (patient is calm and cooperative ) Does patient have access to weapons?: No Criminal Charges Pending?: No Does patient have a court date:  No Is patient on probation?: No  Psychosis Hallucinations: None noted Delusions: None noted  Mental Status Report Appearance/Hygiene: Disheveled Eye Contact: Good Motor Activity: Freedom of movement Speech: Logical/coherent Level of Consciousness: Alert Mood: Depressed Affect: Appropriate to circumstance Anxiety Level: None Thought Processes: Coherent, Relevant Judgement: Impaired Orientation: Person, Place, Time, Situation Obsessive Compulsive Thoughts/Behaviors: None  Cognitive Functioning Concentration: Decreased Memory: Recent Intact IQ: Average Insight: Good Impulse Control: Fair Appetite: Good Weight Loss:  (none reported) Weight Gain:  (none reported ) Sleep: Decreased Total Hours of Sleep:  (varies;6 to 8 hrs per night ) Vegetative Symptoms: None  ADLScreening [facility] Assessment Services) Patient's cognitive ability adequate to safely complete daily activities?: Yes Patient able to express need for assistance with ADLs?: Yes Independently performs ADLs?: Yes (appropriate for developmental age)  Prior Inpatient Therapy Prior Inpatient Therapy: No Prior Therapy Dates:  (n/a) Prior Therapy Facilty/Provider(s):  (n/a) Reason for Treatment:  (n/a)  Prior Outpatient Therapy Prior Outpatient Therapy: Yes Prior Therapy Dates:  (current) Prior Therapy Facilty/Provider(s):  ([facility]) Reason for Treatment:  (ACTT provider) Does patient have an ACCT team?: No Does patient have Intensive In-House Services?  : No Does patient have [facility] services? : No Does patient have P4CC services?: No  ADL Screening (condition at time of admission) Patient's cognitive ability adequate to safely complete daily activities?: Yes Is the patient deaf or have difficulty hearing?: No Does the patient have difficulty seeing, even when wearing glasses/contacts?: No Does the patient have difficulty concentrating, remembering, or making decisions?: No Patient able to express need for  assistance with ADLs?: Yes Does the patient have difficulty dressing or bathing?: No Independently performs ADLs?: Yes (appropriate for developmental age) Does the patient have difficulty walking or climbing stairs?: No Weakness of Legs: None Weakness of Arms/Hands: None  Home Assistive Devices/Equipment Home Assistive Devices/Equipment: None    Abuse/Neglect Assessment (Assessment to be complete while patient is alone) Physical Abuse: Denies Verbal Abuse: Denies Sexual Abuse: Denies Exploitation of patient/patient's resources: Denies Self-Neglect: Denies     Merchant navy officer (For Healthcare) Does Patient Have a Medical Advance Directive?: No    Additional Information 1:1 In Past 12 Months?: No CIRT Risk: No Elopement Risk: No Does patient have medical clearance?: Yes     Disposition: Per Nanine Means, DNP, patient to remain in the ED overnight. Pending am psych evaluation.  Disposition Initial Assessment Completed for this Encounter: Yes Disposition of Patient:  (Observe overnight, pending am psych evaluation)  On Site Evaluation by:   Reviewed with Physician:    Melynda Ripple 07/20/2016 7:08 PM

## 2016-07-20 NOTE — ED Notes (Addendum)
Pt denies SI/HI and Hallucinations/Delusions. Per pt he was aggressive because someone at the facility "lied on him". Pt denies pain or medical concerns.  Pt calm and cooperative with this Clinical research associate.

## 2016-07-21 DIAGNOSIS — Z79899 Other long term (current) drug therapy: Secondary | ICD-10-CM

## 2016-07-21 DIAGNOSIS — F250 Schizoaffective disorder, bipolar type: Secondary | ICD-10-CM | POA: Diagnosis present

## 2016-07-21 NOTE — Consult Note (Addendum)
[facility] Face-to-Face Psychiatry Consult   Reason for Consult:  Aggression  Referring Physician:  EDP Patient Identification: Jason Jenkins MRN:  [id number] Principal Diagnosis: Schizoaffective disorder, bipolar type [facility]) Diagnosis:   Patient Active Problem List   Diagnosis Date Noted  . Schizoaffective disorder, bipolar type (HCC) [F25.0] 07/21/2016    Priority: High  . Aggressive behavior [R45.89]     Total Time spent with patient: 45 minutes  Subjective:   Jason Jenkins is a 41 y.o. male patient does not warrant admission.  HPI:  41 yo male who presented to the ED from [facility] due to aggression after becoming upset with a Chemical engineer who he claims lied on him.  Since arrival at the ED, he has been calm and cooperative.  No suicidal/homicidal ideations, hallucinations, or alcohol/drug abuse.  Stable for discharge, ACT team already in place and will coordinate care with them.  Past Psychiatric History: schizoaffective disorder, bipolar type  Risk to Self: Suicidal Ideation: No Suicidal Intent: No Is patient at risk for suicide?: No Suicidal Plan?: No Access to Means: No What has been your use of drugs/alcohol within the last 12 months?:  (n/a) How many times?:  (0) Other Self Harm Risks:  (no self harm ) Triggers for Past Attempts: Other (Comment) (no previous attempts or gestures ) Intentional Self Injurious Behavior: None Risk to Others: Homicidal Ideation: No Thoughts of Harm to Others: No Current Homicidal Intent: No Current Homicidal Plan: No Access to Homicidal Means: No Identified Victim:  (n/a) History of harm to others?: No Assessment of Violence: None Noted Violent Behavior Description:  (patient is calm and cooperative ) Does patient have access to weapons?: No Criminal Charges Pending?: No Does patient have a court date: No Prior Inpatient Therapy: Prior Inpatient Therapy: No Prior Therapy Dates:  (n/a) Prior Therapy Facilty/Provider(s):  (n/a) Reason  for Treatment:  (n/a) Prior Outpatient Therapy: Prior Outpatient Therapy: Yes Prior Therapy Dates:  (current) Prior Therapy Facilty/Provider(s):  ([facility]) Reason for Treatment:  (ACTT provider) Does patient have an ACCT team?: No Does patient have Intensive In-House Services?  : No Does patient have [facility] services? : No Does patient have P4CC services?: No  Past Medical History:  Past Medical History:  Diagnosis Date  . Bipolar 2 disorder (HCC)   . Diabetes mellitus without complication (HCC)   . Schizoaffective disorder (HCC)    History reviewed. No pertinent surgical history. Family History: No family history on file. Family Psychiatric  History: unknown Social History:  History  Alcohol Use No     History  Drug Use No    Social History   Social History  . Marital status: Single    Spouse name: N/A  . Number of children: N/A  . Years of education: N/A   Social History Main Topics  . Smoking status: Never Smoker  . Smokeless tobacco: None  . Alcohol use No  . Drug use: No  . Sexual activity: Not Asked   Other Topics Concern  . None   Social History Narrative  . None   Additional Social History:    Allergies:   Allergies  Allergen Reactions  . Bee Venom   . Fish Allergy     Labs:  Results for orders placed or performed during the hospital encounter of 07/20/16 (from the past 48 hour(s))  I-stat chem 8, ed     Status: Abnormal   Collection Time: 07/20/16  6:57 PM  Result Value Ref Range   Sodium 140 135 -  145 mmol/L   Potassium 3.8 3.5 - 5.1 mmol/L   Chloride 101 101 - 111 mmol/L   BUN 4 (L) 6 - 20 mg/dL   Creatinine, Ser 1.61 0.61 - 1.24 mg/dL   Glucose, Bld 94 65 - 99 mg/dL   Calcium, Ion 0.96 0.45 - 1.40 mmol/L   TCO2 29 0 - 100 mmol/L   Hemoglobin 13.6 13.0 - 17.0 g/dL   HCT 40.9 81.1 - 91.4 %  POC CBG, ED     Status: Abnormal   Collection Time: 07/20/16  7:11 PM  Result Value Ref Range   Glucose-Capillary 102 (H) 65 - 99 mg/dL     Current Facility-Administered Medications  Medication Dose Route Frequency Provider Last Rate Last Dose  . acetaminophen (TYLENOL) tablet 650 mg  650 mg Oral Q6H PRN Nira Conn, MD      . divalproex (DEPAKOTE ER) 24 hr tablet 1,000 mg  1,000 mg Oral QHS Nira Conn, MD   1,000 mg at 07/20/16 2158  . haloperidol (HALDOL) tablet 15 mg  15 mg Oral QHS Nira Conn, MD   15 mg at 07/20/16 2158  . lisinopril (PRINIVIL,ZESTRIL) tablet 10 mg  10 mg Oral Daily Nira Conn, MD   10 mg at 07/20/16 2158  . metFORMIN (GLUCOPHAGE) tablet 500 mg  500 mg Oral BID WC Nira Conn, MD   500 mg at 07/21/16 0846  . potassium chloride SA (K-DUR,KLOR-CON) CR tablet 40 mEq  40 mEq Oral Daily Nira Conn, MD   40 mEq at 07/20/16 2158   Current Outpatient Prescriptions  Medication Sig Dispense Refill  . divalproex (DEPAKOTE ER) 500 MG 24 hr tablet Take 1,000 mg by mouth at bedtime.    . haloperidol (HALDOL) 10 MG tablet Take 15 mg by mouth at bedtime.    Marland Kitchen lisinopril (PRINIVIL,ZESTRIL) 10 MG tablet Take 10 mg by mouth daily.    . metFORMIN (GLUCOPHAGE) 500 MG tablet Take 500 mg by mouth 2 (two) times daily with a meal.    . potassium chloride SA (K-DUR,KLOR-CON) 20 MEQ tablet Take 40 mEq by mouth daily.       Musculoskeletal: Strength & Muscle Tone: within normal limits Gait & Station: normal Patient leans: N/A  Psychiatric Specialty Exam: Physical Exam  Constitutional: He is oriented to person, place, and time. He appears well-developed and well-nourished.  HENT:  Head: Normocephalic.  Neck: Normal range of motion.  Respiratory: Effort normal.  Musculoskeletal: Normal range of motion.  Neurological: He is alert and oriented to person, place, and time.  Psychiatric: He has a normal mood and affect. His speech is normal and behavior is normal. Judgment and thought content normal. Cognition and memory are normal.    Review of Systems  All  other systems reviewed and are negative.   Blood pressure 124/86, pulse 85, temperature 98 F (36.7 C), temperature source Oral, resp. rate 18, SpO2 99 %.There is no height or weight on file to calculate BMI.  General Appearance: Casual  Eye Contact:  Good  Speech:  Normal Rate  Volume:  Normal  Mood:  Euthymic  Affect:  Congruent  Thought Process:  Coherent and Descriptions of Associations: Intact  Orientation:  Full (Time, Place, and Person)  Thought Content:  WDL and Logical  Suicidal Thoughts:  No  Homicidal Thoughts:  No  Memory:  Immediate;   Good Recent;   Good Remote;   Good  Judgement:  Fair  Insight:  Fair  Psychomotor  Activity:  Normal  Concentration:  Concentration: Good and Attention Span: Good  Recall:  Good  Fund of Knowledge:  Fair  Language:  Good  Akathisia:  No  Handed:  Right  AIMS (if indicated):     Assets:  Housing Leisure Time Physical Health Resilience Social Support  ADL's:  Intact  Cognition:  WNL  Sleep:        Treatment Plan Summary: Daily contact with patient to assess and evaluate symptoms and progress in treatment, Medication management and Plan schizoaffective disorder, bipolar type:  -Crisis stabilization -Medication management:  Continue Depakote 1000 mg at bedtime for mood, Haldol 15 mg at bedtime for psychosis along with medical medications -Individual counseling -Coordination with ACT team  Disposition: No evidence of imminent risk to self or others at present.    Nanine Means, NP 07/21/2016 9:55 AM  Patient seen face-to-face for psychiatric evaluation, chart reviewed and case discussed with the physician extender and developed treatment plan. Reviewed the information documented and agree with the treatment plan. Thedore Mins, MD

## 2016-07-21 NOTE — Discharge Instructions (Signed)
For your ongoing behavioral health needs, you are advised to follow up with the [facility] ACT Team, your outpatient provider:       [facility] ACT Team      [facility], [address]      [address]      [place], [place]  [address]      [phone]

## 2016-07-21 NOTE — ED Notes (Signed)
Pt discharged ambulatory with Discharge instructions.  All belongings were returned to patient.  Bus pass was given.  Pt was calm and cooperative at discharge.

## 2016-07-21 NOTE — BH Assessment (Signed)
[facility] Assessment Progress Note  Per Thedore Mins, MD, this pt does not require psychiatric hospitalization at this time.  Pt presents under IVC initiated by [facility] staff, which expires this afternoon.  However, Dr Jannifer Franklin has rescinded IVC.  Pt is to be discharged from [facility] with recommendation to follow up with the [facility], his outpatient provider.  This has been included in pt's discharge instructions.  Pt's nurse, Kendal Hymen, has been notified.  Doylene Canning, MA Triage Specialist [phone]

## 2016-07-21 NOTE — [facility] Suicide Risk Assessment (Signed)
Suicide Risk Assessment  Discharge Assessment   [facility] Discharge Suicide Risk Assessment   Principal Problem: Schizoaffective disorder, bipolar type [facility]) Discharge Diagnoses:  Patient Active Problem List   Diagnosis Date Noted  . Schizoaffective disorder, bipolar type (HCC) [F25.0] 07/21/2016    Priority: High  . Aggressive behavior [R45.89]     Total Time spent with patient: 45 minutes   Musculoskeletal: Strength & Muscle Tone: within normal limits Gait & Station: normal Patient leans: N/A  Psychiatric Specialty Exam: Physical Exam  Constitutional: He is oriented to person, place, and time. He appears well-developed and well-nourished.  HENT:  Head: Normocephalic.  Neck: Normal range of motion.  Respiratory: Effort normal.  Musculoskeletal: Normal range of motion.  Neurological: He is alert and oriented to person, place, and time.  Psychiatric: He has a normal mood and affect. His speech is normal and behavior is normal. Judgment and thought content normal. Cognition and memory are normal.    Review of Systems  All other systems reviewed and are negative.   Blood pressure 124/86, pulse 85, temperature 98 F (36.7 C), temperature source Oral, resp. rate 18, SpO2 99 %.There is no height or weight on file to calculate BMI.  General Appearance: Casual  Eye Contact:  Good  Speech:  Normal Rate  Volume:  Normal  Mood:  Euthymic  Affect:  Congruent  Thought Process:  Coherent and Descriptions of Associations: Intact  Orientation:  Full (Time, Place, and Person)  Thought Content:  WDL and Logical  Suicidal Thoughts:  No  Homicidal Thoughts:  No  Memory:  Immediate;   Good Recent;   Good Remote;   Good  Judgement:  Fair  Insight:  Fair  Psychomotor Activity:  Normal  Concentration:  Concentration: Good and Attention Span: Good  Recall:  Good  Fund of Knowledge:  Fair  Language:  Good  Akathisia:  No  Handed:  Right  AIMS (if indicated):     Assets:  Housing Leisure  Time Physical Health Resilience Social Support  ADL's:  Intact  Cognition:  WNL  Sleep:      Mental Status Per Nursing Assessment::   On Admission: aggression    Demographic Factors:  Male and Living alone  Loss Factors: NA  Historical Factors: NA  Risk Reduction Factors:   Sense of responsibility to family, Positive social support and Positive therapeutic relationship  Continued Clinical Symptoms:  None  Cognitive Features That Contribute To Risk:  None    Suicide Risk:  Minimal: No identifiable suicidal ideation.  Patients presenting with no risk factors but with morbid ruminations; may be classified as minimal risk based on the severity of the depressive symptoms    Plan Of Care/Follow-up recommendations:  Activity:  as tolerated Diet:  heart healthy diet  Decarla Siemen, NP 07/21/2016, 10:02 AM

## 2016-10-12 DIAGNOSIS — F23 Brief psychotic disorder: Secondary | ICD-10-CM | POA: Diagnosis not present

## 2016-10-12 DIAGNOSIS — R5383 Other fatigue: Secondary | ICD-10-CM | POA: Diagnosis not present

## 2016-10-12 DIAGNOSIS — Z79899 Other long term (current) drug therapy: Secondary | ICD-10-CM | POA: Diagnosis not present

## 2017-01-18 DIAGNOSIS — Z01118 Encounter for examination of ears and hearing with other abnormal findings: Secondary | ICD-10-CM | POA: Diagnosis not present

## 2017-01-18 DIAGNOSIS — F319 Bipolar disorder, unspecified: Secondary | ICD-10-CM | POA: Diagnosis not present

## 2017-01-18 DIAGNOSIS — E669 Obesity, unspecified: Secondary | ICD-10-CM | POA: Diagnosis not present

## 2017-01-18 DIAGNOSIS — E1165 Type 2 diabetes mellitus with hyperglycemia: Secondary | ICD-10-CM | POA: Diagnosis not present

## 2017-01-18 DIAGNOSIS — I10 Essential (primary) hypertension: Secondary | ICD-10-CM | POA: Diagnosis not present

## 2017-01-18 DIAGNOSIS — Z113 Encounter for screening for infections with a predominantly sexual mode of transmission: Secondary | ICD-10-CM | POA: Diagnosis not present

## 2017-01-18 DIAGNOSIS — Z136 Encounter for screening for cardiovascular disorders: Secondary | ICD-10-CM | POA: Diagnosis not present

## 2017-01-18 DIAGNOSIS — Z5181 Encounter for therapeutic drug level monitoring: Secondary | ICD-10-CM | POA: Diagnosis not present

## 2017-01-18 DIAGNOSIS — E539 Vitamin B deficiency, unspecified: Secondary | ICD-10-CM | POA: Diagnosis not present

## 2017-01-18 DIAGNOSIS — E559 Vitamin D deficiency, unspecified: Secondary | ICD-10-CM | POA: Diagnosis not present

## 2017-01-18 DIAGNOSIS — Z720 Tobacco use: Secondary | ICD-10-CM | POA: Diagnosis not present

## 2017-02-08 DIAGNOSIS — F319 Bipolar disorder, unspecified: Secondary | ICD-10-CM | POA: Diagnosis not present

## 2017-02-08 DIAGNOSIS — E559 Vitamin D deficiency, unspecified: Secondary | ICD-10-CM | POA: Diagnosis not present

## 2017-02-08 DIAGNOSIS — I10 Essential (primary) hypertension: Secondary | ICD-10-CM | POA: Diagnosis not present

## 2017-02-08 DIAGNOSIS — E1165 Type 2 diabetes mellitus with hyperglycemia: Secondary | ICD-10-CM | POA: Diagnosis not present

## 2017-02-08 DIAGNOSIS — Z720 Tobacco use: Secondary | ICD-10-CM | POA: Diagnosis not present

## 2017-02-08 DIAGNOSIS — Z8639 Personal history of other endocrine, nutritional and metabolic disease: Secondary | ICD-10-CM | POA: Diagnosis not present

## 2017-02-08 DIAGNOSIS — E539 Vitamin B deficiency, unspecified: Secondary | ICD-10-CM | POA: Diagnosis not present

## 2017-02-08 DIAGNOSIS — E785 Hyperlipidemia, unspecified: Secondary | ICD-10-CM | POA: Diagnosis not present

## 2017-02-08 DIAGNOSIS — E669 Obesity, unspecified: Secondary | ICD-10-CM | POA: Diagnosis not present

## 2017-04-21 DIAGNOSIS — E539 Vitamin B deficiency, unspecified: Secondary | ICD-10-CM | POA: Diagnosis not present

## 2017-04-21 DIAGNOSIS — E559 Vitamin D deficiency, unspecified: Secondary | ICD-10-CM | POA: Diagnosis not present

## 2017-04-21 DIAGNOSIS — F319 Bipolar disorder, unspecified: Secondary | ICD-10-CM | POA: Diagnosis not present

## 2017-04-21 DIAGNOSIS — Z8639 Personal history of other endocrine, nutritional and metabolic disease: Secondary | ICD-10-CM | POA: Diagnosis not present

## 2017-04-21 DIAGNOSIS — Z720 Tobacco use: Secondary | ICD-10-CM | POA: Diagnosis not present

## 2017-04-21 DIAGNOSIS — E669 Obesity, unspecified: Secondary | ICD-10-CM | POA: Diagnosis not present

## 2017-04-21 DIAGNOSIS — E785 Hyperlipidemia, unspecified: Secondary | ICD-10-CM | POA: Diagnosis not present

## 2017-04-21 DIAGNOSIS — E1165 Type 2 diabetes mellitus with hyperglycemia: Secondary | ICD-10-CM | POA: Diagnosis not present

## 2017-04-21 DIAGNOSIS — I10 Essential (primary) hypertension: Secondary | ICD-10-CM | POA: Diagnosis not present

## 2018-01-07 DIAGNOSIS — F209 Schizophrenia, unspecified: Secondary | ICD-10-CM | POA: Diagnosis not present

## 2018-01-07 DIAGNOSIS — F23 Brief psychotic disorder: Secondary | ICD-10-CM | POA: Diagnosis not present

## 2018-01-07 DIAGNOSIS — Z79899 Other long term (current) drug therapy: Secondary | ICD-10-CM | POA: Diagnosis not present

## 2018-08-18 ENCOUNTER — Encounter (HOSPITAL_COMMUNITY): Payer: Self-pay | Admitting: Emergency Medicine

## 2018-08-18 ENCOUNTER — Emergency Department (HOSPITAL_COMMUNITY)
Admission: EM | Admit: 2018-08-18 | Discharge: 2018-08-18 | Disposition: A | Discharge status: Home / Self Care | Payer: Medicare Other | Attending: Emergency Medicine | Admitting: Emergency Medicine

## 2018-08-18 ENCOUNTER — Other Ambulatory Visit: Payer: Self-pay

## 2018-08-18 DIAGNOSIS — Z7984 Long term (current) use of oral hypoglycemic drugs: Secondary | ICD-10-CM | POA: Insufficient documentation

## 2018-08-18 DIAGNOSIS — F250 Schizoaffective disorder, bipolar type: Secondary | ICD-10-CM | POA: Diagnosis not present

## 2018-08-18 DIAGNOSIS — Z79899 Other long term (current) drug therapy: Secondary | ICD-10-CM | POA: Insufficient documentation

## 2018-08-18 DIAGNOSIS — F209 Schizophrenia, unspecified: Secondary | ICD-10-CM

## 2018-08-18 DIAGNOSIS — R4689 Other symptoms and signs involving appearance and behavior: Secondary | ICD-10-CM | POA: Diagnosis present

## 2018-08-18 DIAGNOSIS — Z1159 Encounter for screening for other viral diseases: Secondary | ICD-10-CM | POA: Diagnosis not present

## 2018-08-18 DIAGNOSIS — R443 Hallucinations, unspecified: Secondary | ICD-10-CM | POA: Diagnosis not present

## 2018-08-18 DIAGNOSIS — F17210 Nicotine dependence, cigarettes, uncomplicated: Secondary | ICD-10-CM | POA: Diagnosis not present

## 2018-08-18 DIAGNOSIS — R440 Auditory hallucinations: Secondary | ICD-10-CM | POA: Diagnosis not present

## 2018-08-18 DIAGNOSIS — E119 Type 2 diabetes mellitus without complications: Secondary | ICD-10-CM | POA: Diagnosis not present

## 2018-08-18 DIAGNOSIS — R45850 Homicidal ideations: Secondary | ICD-10-CM | POA: Diagnosis not present

## 2018-08-18 DIAGNOSIS — Z03818 Encounter for observation for suspected exposure to other biological agents ruled out: Secondary | ICD-10-CM | POA: Diagnosis not present

## 2018-08-18 LAB — COMPREHENSIVE METABOLIC PANEL
ALT: 25 U/L (ref 0–44)
AST: 24 U/L (ref 15–41)
Albumin: 4 g/dL (ref 3.5–5.0)
Alkaline Phosphatase: 64 U/L (ref 38–126)
Anion gap: 10 (ref 5–15)
BUN: <5 mg/dL — ABNORMAL LOW (ref 6–20)
CO2: 25 mmol/L (ref 22–32)
Calcium: 9.2 mg/dL (ref 8.9–10.3)
Chloride: 105 mmol/L (ref 98–111)
Creatinine, Ser: 0.85 mg/dL (ref 0.61–1.24)
GFR calc Af Amer: >60 mL/min (ref >60)
GFR calc non Af Amer: >60 mL/min (ref >60)
Glucose, Bld: 64 mg/dL — ABNORMAL LOW (ref 70–99)
Potassium: 3.5 mmol/L (ref 3.5–5.1)
Sodium: 140 mmol/L (ref 135–145)
Total Bilirubin: 0.2 mg/dL — ABNORMAL LOW (ref 0.3–1.2)
Total Protein: 7.2 g/dL (ref 6.5–8.1)

## 2018-08-18 LAB — URINALYSIS, ROUTINE W REFLEX MICROSCOPIC
Bilirubin Urine: NEGATIVE
Glucose, UA: 50 mg/dL — AB
Hgb urine dipstick: NEGATIVE
Ketones, ur: NEGATIVE mg/dL
Leukocytes,Ua: NEGATIVE
Nitrite: NEGATIVE
Protein, ur: NEGATIVE mg/dL
Specific Gravity, Urine: 1.005 (ref 1.005–1.030)
pH: 6 (ref 5.0–8.0)

## 2018-08-18 LAB — CBC WITH DIFFERENTIAL/PLATELET
Abs Immature Granulocytes: 0.01 10*3/uL (ref 0.00–0.07)
Basophils Absolute: 0 10*3/uL (ref 0.0–0.1)
Basophils Relative: 0 %
Eosinophils Absolute: 0.2 10*3/uL (ref 0.0–0.5)
Eosinophils Relative: 3 %
HCT: 43.3 % (ref 39.0–52.0)
Hemoglobin: 14 g/dL (ref 13.0–17.0)
Immature Granulocytes: 0 %
Lymphocytes Relative: 40 %
Lymphs Abs: 2.4 10*3/uL (ref 0.7–4.0)
MCH: 31.9 pg (ref 26.0–34.0)
MCHC: 32.3 g/dL (ref 30.0–36.0)
MCV: 98.6 fL (ref 80.0–100.0)
Monocytes Absolute: 0.8 10*3/uL (ref 0.1–1.0)
Monocytes Relative: 13 %
Neutro Abs: 2.7 10*3/uL (ref 1.7–7.7)
Neutrophils Relative %: 44 %
Platelets: 235 10*3/uL (ref 150–400)
RBC: 4.39 MIL/uL (ref 4.22–5.81)
RDW: 12.9 % (ref 11.5–15.5)
WBC: 6.1 10*3/uL (ref 4.0–10.5)
nRBC: 0 % (ref 0.0–0.2)

## 2018-08-18 LAB — RAPID URINE DRUG SCREEN, HOSP PERFORMED
Amphetamines: NOT DETECTED
Barbiturates: NOT DETECTED
Benzodiazepines: NOT DETECTED
Cocaine: NOT DETECTED
Opiates: NOT DETECTED
Tetrahydrocannabinol: NOT DETECTED

## 2018-08-18 LAB — CBG MONITORING, ED
Glucose-Capillary: 67 mg/dL — ABNORMAL LOW (ref 70–99)
Glucose-Capillary: 119 mg/dL — ABNORMAL HIGH (ref 70–99)

## 2018-08-18 LAB — ACETAMINOPHEN LEVEL: Acetaminophen (Tylenol), Serum: <10 ug/mL — ABNORMAL LOW (ref 10–30)

## 2018-08-18 LAB — ETHANOL: Alcohol, Ethyl (B): <10 mg/dL (ref <10)

## 2018-08-18 LAB — SARS CORONAVIRUS 2 BY RT PCR (HOSPITAL ORDER, PERFORMED IN ~~LOC~~ HOSPITAL LAB): SARS Coronavirus 2: NEGATIVE

## 2018-08-18 LAB — SALICYLATE LEVEL: Salicylate Lvl: <7 mg/dL (ref 2.8–30.0)
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
## 2018-08-18 NOTE — ED Provider Notes (Signed)
[facility] Provider Note   CSN: [id number] Arrival date & time: 08/18/18  [id number]    History   Chief Complaint Chief Complaint  Patient presents with  . Psychiatric Evaluation    HPI Olin HauserBennie Lee Gallina is a 43 y.o. male.   HPI [age] year old male with a history of schizophrenia, bipolar 2 disorder, and DM presents for psychiatric evaluation.  Patient approached police this morning and was demonstrating bizarre behavior.  On my evaluation, patient is tangential and exhibiting flight of ideas.  It is unclear whether he has been compliant with his medications.  He thinks that his medications are causing him to hear voices.  He reports hearing voices for the past month voices are not telling him to harm himself or others.  He has had passive homicidal ideation.  Denies SI.  Patient making statements like "there is blood in the rain" and I only eat bodies".  Past Medical History:  Diagnosis Date  . Bipolar 2 disorder (HCC)   . Diabetes mellitus without complication (HCC)   . Schizoaffective disorder [facility](HCC)     Patient Active Problem List   Diagnosis Date Noted  . Schizoaffective disorder, bipolar type (HCC) 07/21/2016  . Aggressive behavior     History reviewed. No pertinent surgical history.      Home Medications    Prior to Admission medications   Medication Sig Start Date End Date Taking? Authorizing Provider  divalproex (DEPAKOTE ER) 500 MG 24 hr tablet Take 1,000 mg by mouth at bedtime.    [provider]  haloperidol (HALDOL) 10 MG tablet Take 15 mg by mouth at bedtime.    [provider]  lisinopril (PRINIVIL,ZESTRIL) 10 MG tablet Take 10 mg by mouth daily.    [provider]  metFORMIN (GLUCOPHAGE) 500 MG tablet Take 500 mg by mouth 2 (two) times daily with a meal.    [provider]  potassium chloride SA (K-DUR,KLOR-CON) 20 MEQ tablet Take 40 mEq by mouth daily.     [provider]     Family History No family history on file.  Social History Social History   Tobacco Use  . Smoking status: Current Every Day Smoker    Packs/day: 0.30  . Smokeless tobacco: Never Used  Substance Use Topics  . Alcohol use: No  . Drug use: No     Allergies   Bee venom and Fish allergy   Review of Systems Review of Systems  Constitutional: Negative for chills and fever.  HENT: Negative for ear pain and sore throat.   Eyes: Negative for pain and visual disturbance.  Respiratory: Negative for cough and shortness of breath.   Cardiovascular: Negative for chest pain and palpitations.  Gastrointestinal: Negative for abdominal pain and vomiting.  Genitourinary: Negative for dysuria and hematuria.  Musculoskeletal: Negative for arthralgias and back pain.  Skin: Negative for color change and rash.  Neurological: Negative for seizures and syncope.  Psychiatric/Behavioral: Positive for hallucinations. The patient is nervous/anxious.        Auditory hallucinations Homicidal ideation  All other systems reviewed and are negative.    Physical Exam Updated Vital Signs BP (!) 149/106 (BP Location: Right Arm)   Pulse (!) 106   Temp (!) 97.5 F (36.4 C) (Oral)   Resp 16   SpO2 99%   Physical Exam Vitals signs and nursing note reviewed.  Constitutional:      Appearance: He is well-developed.  HENT:     Head: Normocephalic and  atraumatic.  Eyes:     Conjunctiva/sclera: Conjunctivae normal.  Neck:     Musculoskeletal: Neck supple.  Cardiovascular:     Rate and Rhythm: Normal rate and regular rhythm.     Heart sounds: No murmur.  Pulmonary:     Effort: Pulmonary effort is normal. No respiratory distress.     Breath sounds: Normal breath sounds.  Abdominal:     Palpations: Abdomen is soft.     Tenderness: There is no abdominal tenderness.  Skin:    General: Skin is warm and dry.  Neurological:     General: No focal deficit present.     Mental Status: He is alert and  oriented to person, place, and time.  Psychiatric:        Attention and Perception: Attention normal.        Speech: Speech is tangential.        Thought Content: Thought content is paranoid. Thought content includes homicidal ideation.        Cognition and Memory: Cognition normal.        Judgment: Judgment is inappropriate.      ED Treatments / Results  Labs (all labs ordered are listed, but only abnormal results are displayed) Labs Reviewed - No data to display  EKG None  Radiology No results found.  Procedures Procedures (including critical care time)  Medications Ordered in ED Medications - No data to display   Initial Impression / Assessment and Plan / ED Course  I have reviewed the triage vital signs and the nursing notes.  Pertinent labs & imaging results that were available during my care of the patient were reviewed by me and considered in my medical decision making (see chart for details).  43 year old male with a history of schizophrenia, bipolar 2 disorder, and DM presents for psychiatric evaluation.  Hemodynamically stable.  Afebrile.  Patient denies infectious symptoms.   Labs notable for glucose of 64 but otherwise unremarkable.  Urine drug screen negative.   EKG normal sinus rhythm.  Nonspecific T wave changes.  QTc 411. Patient denies chest pain or shortness of breath.  Diet ordered.  Repeat glucose is 119.  Patient is medically cleared.  Patient evaluated by psychiatry.  No indication for IVC or admission at this time.  Patient felt to be safe for discharge.  His ACTT team was contacted and notified of his impending discharge.  Final Clinical Impressions(s) / ED Diagnoses   Final diagnoses:  None    ED Discharge Orders    None       Vallery Ridge, MD 08/18/18 1455    Blane Ohara, MD 08/18/18 1530

## 2018-08-18 NOTE — ED Notes (Signed)
Patient reported to MD during assessment that he heard voices telling him to kill someone last night and sees blood in the rain.

## 2018-08-18 NOTE — ED Notes (Signed)
Patient given a [place] sandwhich, applesauce and a coke.

## 2018-08-18 NOTE — ED Notes (Signed)
Patient doing TTS consult at this time.
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
## 2018-08-18 NOTE — Discharge Instructions (Addendum)
Follow-up with your ACTT team.

## 2018-08-18 NOTE — BHH Counselor (Signed)
  BHH ASSESSMENT DISPOSITION  [facility] discussed case with BH Provider, Denzil Magnuson, NP who recommended that the pt discharge and follow up with his ACTT Team. [facility] contacted Serina Cowper at [facility] to inform pt's ACTT team of pt's upcoming discharge.  Kleber Crean L. Creta Dorame, MS, [facility], [facility] Therapeutic Triage Specialist  [phone]

## 2019-05-30 DIAGNOSIS — Z79899 Other long term (current) drug therapy: Secondary | ICD-10-CM | POA: Diagnosis not present

## 2019-05-30 DIAGNOSIS — F209 Schizophrenia, unspecified: Secondary | ICD-10-CM | POA: Diagnosis not present

## 2019-06-01 ENCOUNTER — Other Ambulatory Visit: Payer: Self-pay

## 2019-06-01 ENCOUNTER — Emergency Department (HOSPITAL_COMMUNITY)
Admission: EM | Admit: 2019-06-01 | Discharge: 2019-06-01 | Disposition: A | Discharge status: Home / Self Care | Payer: Medicare Other | Attending: Emergency Medicine | Admitting: Emergency Medicine

## 2019-06-01 ENCOUNTER — Encounter (HOSPITAL_COMMUNITY): Payer: Self-pay | Admitting: Pharmacy Technician

## 2019-06-01 DIAGNOSIS — F17210 Nicotine dependence, cigarettes, uncomplicated: Secondary | ICD-10-CM | POA: Diagnosis not present

## 2019-06-01 DIAGNOSIS — R000 Tachycardia, unspecified: Secondary | ICD-10-CM | POA: Diagnosis not present

## 2019-06-01 DIAGNOSIS — Z79899 Other long term (current) drug therapy: Secondary | ICD-10-CM | POA: Insufficient documentation

## 2019-06-01 DIAGNOSIS — Z7984 Long term (current) use of oral hypoglycemic drugs: Secondary | ICD-10-CM | POA: Insufficient documentation

## 2019-06-01 DIAGNOSIS — E875 Hyperkalemia: Secondary | ICD-10-CM | POA: Diagnosis not present

## 2019-06-01 DIAGNOSIS — E119 Type 2 diabetes mellitus without complications: Secondary | ICD-10-CM | POA: Diagnosis not present

## 2019-06-01 LAB — CBC
HCT: 43.9 % (ref 39.0–52.0)
Hemoglobin: 14.1 g/dL (ref 13.0–17.0)
MCH: 31.4 pg (ref 26.0–34.0)
MCHC: 32.1 g/dL (ref 30.0–36.0)
MCV: 97.8 fL (ref 80.0–100.0)
Platelets: 194 10*3/uL (ref 150–400)
RBC: 4.49 MIL/uL (ref 4.22–5.81)
RDW: 14.3 % (ref 11.5–15.5)
WBC: 6 10*3/uL (ref 4.0–10.5)
nRBC: 0 % (ref 0.0–0.2)

## 2019-06-01 LAB — BASIC METABOLIC PANEL
Anion gap: 10 (ref 5–15)
BUN: 8 mg/dL (ref 6–20)
CO2: 24 mmol/L (ref 22–32)
Calcium: 9.1 mg/dL (ref 8.9–10.3)
Chloride: 101 mmol/L (ref 98–111)
Creatinine, Ser: 1.14 mg/dL (ref 0.61–1.24)
GFR calc Af Amer: >60 mL/min (ref >60)
GFR calc non Af Amer: >60 mL/min (ref >60)
Glucose, Bld: 111 mg/dL — ABNORMAL HIGH (ref 70–99)
Potassium: 4.4 mmol/L (ref 3.5–5.1)
Sodium: 135 mmol/L (ref 135–145)

## 2019-06-01 NOTE — ED Notes (Signed)
Patient walked out of treatment room and stated to reg. That he was leaving he had been discharged however he hasn't been discharged.

## 2019-06-01 NOTE — ED Provider Notes (Addendum)
[facility] EMERGENCY DEPARTMENT Provider Note   CSN: [id number] Arrival date & time: 06/01/19  1147     History Chief Complaint  Patient presents with  . Abnormal Lab    Jason Jenkins is a 44 y.o. male.  The history is provided by the patient and medical records. No language interpreter was used.  Abnormal Lab    44 year old male with history of schizophrenia, bipolar, diabetes, sent here by PCP for further evaluation and management of hyperkalemia.  Patient report he was seen for a routine checkup 2 days ago and had blood work done.  Today he was notified that his potassium is high and would need to come to the ER for further evaluation.  Patient otherwise denies having any active symptoms.  No chest pain no body aches no weakness no nausea vomiting diarrhea no medication changes.  Complaints of any shortness of breath.  No active pain.  And no recent sickness.  Past Medical History:  Diagnosis Date  . Bipolar 2 disorder (HCC)   . Diabetes mellitus without complication (HCC)   . Schizoaffective disorder [facility])     Patient Active Problem List   Diagnosis Date Noted  . Schizoaffective disorder, bipolar type (HCC) 07/21/2016  . Aggressive behavior     History reviewed. No pertinent surgical history.     No family history on file.  Social History   Tobacco Use  . Smoking status: Current Every Day Smoker    Packs/day: 0.30  . Smokeless tobacco: Never Used  Substance Use Topics  . Alcohol use: No  . Drug use: No    Home Medications Prior to Admission medications   Medication Sig Start Date End Date Taking? Authorizing Provider  divalproex (DEPAKOTE ER) 500 MG 24 hr tablet Take 1,000 mg by mouth at bedtime.    [provider]  haloperidol (HALDOL) 10 MG tablet Take 15 mg by mouth at bedtime.    [provider]  lisinopril (PRINIVIL,ZESTRIL) 10 MG tablet Take 10 mg by mouth daily.    [provider]  metFORMIN  (GLUCOPHAGE) 500 MG tablet Take 500 mg by mouth 2 (two) times daily with a meal.    [provider]  potassium chloride SA (K-DUR,KLOR-CON) 20 MEQ tablet Take 40 mEq by mouth daily.     [provider]    Allergies    Fish allergy and Bee venom  Review of Systems   Review of Systems  All other systems reviewed and are negative.   Physical Exam Updated Vital Signs BP (!) 168/133 (BP Location: Right Arm)   Pulse 62   Temp 98.3 F (36.8 C) (Oral)   Resp 18   Ht 5\' 11"  (1.803 m)   Wt 97.5 kg   SpO2 98%   BMI 29.99 kg/m   Physical Exam Vitals and nursing note reviewed.  Constitutional:      General: He is not in acute distress.    Appearance: He is well-developed.  HENT:     Head: Atraumatic.  Eyes:     Conjunctiva/sclera: Conjunctivae normal.  Cardiovascular:     Rate and Rhythm: Normal rate and regular rhythm.     Pulses: Normal pulses.     Heart sounds: Normal heart sounds.  Pulmonary:     Effort: Pulmonary effort is normal.     Breath sounds: Normal breath sounds.  Abdominal:     General: Abdomen is flat.     Palpations: Abdomen is soft.  Tenderness: There is no abdominal tenderness.  Musculoskeletal:     Cervical back: Neck supple.     Comments:  5 out of 5 strength to all 4 extremities.  Skin:    Findings: No rash.  Neurological:     Mental Status: He is alert and oriented to person, place, and time.  Psychiatric:        Mood and Affect: Mood normal.     ED Results / Procedures / Treatments   Labs (all labs ordered are listed, but only abnormal results are displayed) Labs Reviewed  BASIC METABOLIC PANEL - Abnormal; Notable for the following components:      Result Value   Glucose, Bld 111 (*)    All other components within normal limits  CBC    EKG EKG Interpretation  Date/Time:  Thursday June 01 2019 11:59:35 EST Ventricular Rate:  108 PR Interval:  140 QRS Duration: 74 QT Interval:  332 QTC Calculation: 444 R  Axis:   69 Text Interpretation: Sinus tachycardia ST & T wave abnormality, consider inferolateral ischemia Abnormal ECG Confirmed by Davonna Belling [id number]) on 06/01/2019 1:36:29 PM   ED ECG REPORT   Date: 06/01/2019  Rate: 108  Rhythm: sinus tachycardia  QRS Axis: normal  Intervals: normal  ST/T Wave abnormalities: nonspecific T wave changes and nonspecific ST/T changes  Conduction Disutrbances:none  Narrative Interpretation:   Old EKG Reviewed: unchanged  I have personally reviewed the EKG tracing and agree with the computerized printout as noted.   Radiology No results found.  Procedures Procedures (including critical care time)  Medications Ordered in ED Medications - No data to display  ED Course  I have reviewed the triage vital signs and the nursing notes.  Pertinent labs & imaging results that were available during my care of the patient were reviewed by me and considered in my medical decision making (see chart for details).    MDM Rules/Calculators/A&P                      BP (!) 168/133 (BP Location: Right Arm)   Pulse 62   Temp 98.3 F (36.8 C) (Oral)   Resp 18   Ht 5\' 11"  (1.803 m)   Wt 97.5 kg   SpO2 98%   BMI 29.99 kg/m   Final Clinical Impression(s) / ED Diagnoses Final diagnoses:  High serum potassium level    Rx / DC Orders ED Discharge Orders    None     1:31 PM Patient has had routine blood work 2 days ago and result shows evidence of hypokalemia with potassium of 8.1.  Will need to recheck this value as it could be due to hemolysis of blood causing a falsely elevated potassium.  Patient currently without any active symptoms.  1:31 PM On repeat lab work, potassium is normal at 4.4.  Normal electrolytes panel.  Normal renal function.  Patient however eloped without notifying anyone.   Domenic Moras, PA-C 06/01/19 1334    Domenic Moras, PA-C 06/01/19 1336    Davonna Belling, MD 06/01/19 1515

## 2019-06-01 NOTE — ED Triage Notes (Signed)
Pt arrives via pov with c/o potassium 8.1 on march 2nd. Pt was seen for routine check up and called today with the results. Pt denies CP, shob.

## 2019-06-01 NOTE — ED Notes (Signed)
Patient walked out MD aware of current K level 4.4

## 2021-11-13 ENCOUNTER — Encounter (HOSPITAL_COMMUNITY): Payer: Self-pay

## 2021-11-13 ENCOUNTER — Other Ambulatory Visit: Payer: Self-pay

## 2021-11-13 ENCOUNTER — Emergency Department (HOSPITAL_COMMUNITY)
Admission: EM | Admit: 2021-11-13 | Discharge: 2021-11-13 | Disposition: A | Discharge status: Home / Self Care | Payer: Medicare Other | Attending: Emergency Medicine | Admitting: Emergency Medicine

## 2021-11-13 ENCOUNTER — Emergency Department (HOSPITAL_COMMUNITY): Payer: Medicare Other

## 2021-11-13 DIAGNOSIS — Z79899 Other long term (current) drug therapy: Secondary | ICD-10-CM | POA: Diagnosis not present

## 2021-11-13 DIAGNOSIS — R634 Abnormal weight loss: Secondary | ICD-10-CM | POA: Diagnosis not present

## 2021-11-13 DIAGNOSIS — E119 Type 2 diabetes mellitus without complications: Secondary | ICD-10-CM | POA: Insufficient documentation

## 2021-11-13 DIAGNOSIS — X58XXXA Exposure to other specified factors, initial encounter: Secondary | ICD-10-CM | POA: Insufficient documentation

## 2021-11-13 DIAGNOSIS — S6991XA Unspecified injury of right wrist, hand and finger(s), initial encounter: Secondary | ICD-10-CM | POA: Insufficient documentation

## 2021-11-13 DIAGNOSIS — Z23 Encounter for immunization: Secondary | ICD-10-CM | POA: Insufficient documentation

## 2021-11-13 DIAGNOSIS — Z7984 Long term (current) use of oral hypoglycemic drugs: Secondary | ICD-10-CM | POA: Insufficient documentation

## 2021-11-13 LAB — CBC WITH DIFFERENTIAL/PLATELET
Abs Immature Granulocytes: 0.03 10*3/uL (ref 0.00–0.07)
Basophils Absolute: 0 10*3/uL (ref 0.0–0.1)
Basophils Relative: 1 %
Eosinophils Absolute: 0.1 10*3/uL (ref 0.0–0.5)
Eosinophils Relative: 4 %
HCT: 32.1 % — ABNORMAL LOW (ref 39.0–52.0)
Hemoglobin: 10.5 g/dL — ABNORMAL LOW (ref 13.0–17.0)
Immature Granulocytes: 1 %
Lymphocytes Relative: 36 %
Lymphs Abs: 1.5 10*3/uL (ref 0.7–4.0)
MCH: 32.5 pg (ref 26.0–34.0)
MCHC: 32.7 g/dL (ref 30.0–36.0)
MCV: 99.4 fL (ref 80.0–100.0)
Monocytes Absolute: 0.5 10*3/uL (ref 0.1–1.0)
Monocytes Relative: 12 %
Neutro Abs: 1.9 10*3/uL (ref 1.7–7.7)
Neutrophils Relative %: 46 %
Platelets: 207 10*3/uL (ref 150–400)
RBC: 3.23 MIL/uL — ABNORMAL LOW (ref 4.22–5.81)
RDW: 14.1 % (ref 11.5–15.5)
WBC: 4 10*3/uL (ref 4.0–10.5)
nRBC: 0 % (ref 0.0–0.2)

## 2021-11-13 LAB — BASIC METABOLIC PANEL
Anion gap: 8 (ref 5–15)
BUN: 9 mg/dL (ref 6–20)
CO2: 25 mmol/L (ref 22–32)
Calcium: 8.5 mg/dL — ABNORMAL LOW (ref 8.9–10.3)
Chloride: 107 mmol/L (ref 98–111)
Creatinine, Ser: 0.79 mg/dL (ref 0.61–1.24)
GFR, Estimated: >60 mL/min (ref >60)
Glucose, Bld: 135 mg/dL — ABNORMAL HIGH (ref 70–99)
Potassium: 3.3 mmol/L — ABNORMAL LOW (ref 3.5–5.1)
Sodium: 140 mmol/L (ref 135–145)

## 2021-11-13 MED ORDER — TETANUS-DIPHTH-ACELL PERTUSSIS 5-2.5-18.5 LF-MCG/0.5 IM SUSY
0.5000 mL | PREFILLED_SYRINGE | Freq: Once | INTRAMUSCULAR | Status: AC
  Administered 2021-11-13: 0.5 mL via INTRAMUSCULAR
  Filled 2021-11-13: qty 0.5

## 2021-11-13 MED ORDER — AMOXICILLIN-POT CLAVULANATE 875-125 MG PO TABS: 1.0000 | ORAL_TABLET | Freq: Two times a day (BID) | ORAL | 0 refills | Status: AC

## 2021-11-13 NOTE — ED Notes (Signed)
Patient transported to X-ray 

## 2021-11-13 NOTE — Discharge Instructions (Addendum)
Return for any problem.   Follow up with Hand (Dr. Everardo Pacific) for treatment of your injured finger.

## 2021-11-13 NOTE — ED Triage Notes (Signed)
Brought in by [facility],pt brother states pt has been "missing" for a few days. Pt has hx of schizophrenia and has not been taking meds, has not been eating for a few days, and has a possible fx on the right hand third digit.

## 2021-11-13 NOTE — ED Provider Notes (Signed)
[facility] Provider Note   CSN: [id number] Arrival date & time: 11/13/21  1106     History  Chief Complaint  Patient presents with   Weight Loss   Finger Injury    Jason Jenkins is a 46 y.o. male.  45 year old male with prior medical history as detailed below presents for evaluation.  Patient reports that he injured his right middle finger approximately 3 weeks ago.  He reports that he punched a mailbox.  He reports that he thinks that his finger is broken.  He has not seen a provider about this injury.  He is right-handed.  Patient reports that he has a prior history of diabetes and schizoaffective disorder.  Patient reports compliance with his medications.  The history is provided by the patient and medical records.       Home Medications Prior to Admission medications   Medication Sig Start Date End Date Taking? Authorizing Provider  divalproex (DEPAKOTE ER) 500 MG 24 hr tablet Take 1,000 mg by mouth at bedtime.    [provider]  haloperidol (HALDOL) 10 MG tablet Take 15 mg by mouth at bedtime.    [provider]  lisinopril (PRINIVIL,ZESTRIL) 10 MG tablet Take 10 mg by mouth daily.    [provider]  metFORMIN (GLUCOPHAGE) 500 MG tablet Take 500 mg by mouth 2 (two) times daily with a meal.    [provider]  potassium chloride SA (K-DUR,KLOR-CON) 20 MEQ tablet Take 40 mEq by mouth daily.     [provider]      Allergies    Fish allergy and Bee venom    Review of Systems   Review of Systems  All other systems reviewed and are negative.   Physical Exam Updated Vital Signs BP 116/83 (BP Location: Left Arm)   Pulse 92   Temp 98.3 F (36.8 C) (Oral)   Resp 18   SpO2 99%  Physical Exam Vitals and nursing note reviewed.  Constitutional:      General: He is not in acute distress.    Appearance: Normal appearance. He is well-developed.  HENT:     Head: Normocephalic and  atraumatic.  Eyes:     Conjunctiva/sclera: Conjunctivae normal.     Pupils: Pupils are equal, round, and reactive to light.  Cardiovascular:     Rate and Rhythm: Normal rate and regular rhythm.     Heart sounds: Normal heart sounds.  Pulmonary:     Effort: Pulmonary effort is normal. No respiratory distress.     Breath sounds: Normal breath sounds.  Abdominal:     General: There is no distension.     Palpations: Abdomen is soft.     Tenderness: There is no abdominal tenderness.  Musculoskeletal:        General: No deformity. Normal range of motion.     Cervical back: Normal range of motion and neck supple.     Comments: Right middle finger is held in a flexed position.  There is notable edema to the finger itself.  Patient is unable to extend the finger.  Passive extension is limited by pain.  See photo below.  Skin:    General: Skin is warm and dry.  Neurological:     General: No focal deficit present.     Mental Status: He is alert and oriented to person, place, and time.          ED Results / Procedures / Treatments   Labs (  all labs ordered are listed, but only abnormal results are displayed) Labs Reviewed  BASIC METABOLIC PANEL - Abnormal; Notable for the following components:      Result Value   Potassium 3.3 (*)    Glucose, Bld 135 (*)    Calcium 8.5 (*)    All other components within normal limits  CBC WITH DIFFERENTIAL/PLATELET - Abnormal; Notable for the following components:   RBC 3.23 (*)    Hemoglobin 10.5 (*)    HCT 32.1 (*)    All other components within normal limits    EKG None  Radiology DG Hand Complete Right  Result Date: 11/13/2021 CLINICAL DATA:  Injury to right hand. EXAM: RIGHT HAND - COMPLETE 3+ VIEW COMPARISON:  None Available. FINDINGS: No visible acute fracture or dislocation. The third finger is persistently bent at the PIP joint and it is therefore very difficult to evaluate this finger. No significant arthropathy. Soft tissues are  unremarkable. IMPRESSION: No visualized acute fracture. The third finger is bent at the PIP joint and is therefore difficult to evaluate. Electronically Signed   By: Irish Lack M.D.   On: 11/13/2021 12:12    Procedures Procedures    Medications Ordered in ED Medications - No data to display  ED Course/ Medical Decision Making/ A&P                           Medical Decision Making Amount and/or Complexity of Data Reviewed Labs: ordered. Radiology: ordered.  Risk Prescription drug management.    Medical Screen Complete  This patient presented to the ED with complaint of injury to right hand.  This complaint involves an extensive number of treatment options. The initial differential diagnosis includes, but is not limited to, trauma to finger that occurred 3 weeks prior  This presentation is: Acute, Self-Limited, Previously Undiagnosed, Uncertain Prognosis, Complicated, Systemic Symptoms, and Threat to Life/Bodily Function  Presents with complaint of right middle finger injury that occurred 3 weeks ago.  Patient's exam is concerning for possible tendon disruption versus trigger finger versus undiagnosed fracture.  Imaging does not reveal evidence of fracture or dislocation.  Case discussed with Earney Hamburg who agrees with plan for outpatient follow-up in the clinic.  Patient and patient's brother understand need for close outpatient follow-up.  Strict return precautions given and understood.  Importance close follow-up stressed.    Co morbidities that complicated the patient's evaluation  schizo affective disorder   Additional history obtained:  Additional history obtained from [facility] External records from outside sources obtained and reviewed including prior ED visits and prior Inpatient records.    Lab Tests:  I ordered and personally interpreted labs.  The pertinent results include: CBC, BMP   Imaging Studies ordered:  I ordered imaging studies  including plain films of right hand I independently visualized and interpreted obtained imaging which showed no fracture or dislocation I agree with the radiologist interpretation.  Problem List / ED Course:  Right middle finger injury   Reevaluation:  After the interventions noted above, I reevaluated the patient and found that they have: stayed the same  Disposition:  After consideration of the diagnostic results and the patients response to treatment, I feel that the patent would benefit from close outpatient follow-up.          Final Clinical Impression(s) / ED Diagnoses Final diagnoses:  Injury of finger of right hand, initial encounter    Rx / DC Orders ED Discharge Orders  Ordered    amoxicillin-clavulanate (AUGMENTIN) 875-125 MG tablet  Every 12 hours        11/13/21 1316              Wynetta Fines, MD 11/13/21 1321
